# Patient Record
Sex: Female | Born: 1960 | Race: Black or African American | Hispanic: No | Smoking: Former smoker
Health system: Southern US, Community
[De-identification: ages and names within clinical notes are randomized; demographics above are authoritative.]

## PROBLEM LIST (undated history)

## (undated) DIAGNOSIS — D649 Anemia, unspecified: Secondary | ICD-10-CM

## (undated) DIAGNOSIS — K219 Gastro-esophageal reflux disease without esophagitis: Secondary | ICD-10-CM

## (undated) DIAGNOSIS — F419 Anxiety disorder, unspecified: Secondary | ICD-10-CM

## (undated) DIAGNOSIS — M1990 Unspecified osteoarthritis, unspecified site: Secondary | ICD-10-CM

## (undated) DIAGNOSIS — G4730 Sleep apnea, unspecified: Secondary | ICD-10-CM

## (undated) DIAGNOSIS — F329 Major depressive disorder, single episode, unspecified: Secondary | ICD-10-CM

## (undated) DIAGNOSIS — M629 Disorder of muscle, unspecified: Secondary | ICD-10-CM

## (undated) DIAGNOSIS — G5600 Carpal tunnel syndrome, unspecified upper limb: Secondary | ICD-10-CM

## (undated) DIAGNOSIS — G8929 Other chronic pain: Secondary | ICD-10-CM

## (undated) DIAGNOSIS — F32A Depression, unspecified: Secondary | ICD-10-CM

## (undated) HISTORY — PX: ABDOMINAL HYSTERECTOMY: SHX81

## (undated) HISTORY — PX: CHOLECYSTECTOMY: SHX55

## (undated) HISTORY — PX: GYNECOLOGIC CRYOSURGERY: SHX857

## (undated) HISTORY — DX: Unspecified osteoarthritis, unspecified site: M19.90

## (undated) HISTORY — DX: Anemia, unspecified: D64.9

---

## 1999-04-11 HISTORY — PX: CARPAL TUNNEL RELEASE: SHX101

## 2005-04-10 HISTORY — PX: HYSTEROTOMY: SHX1776

## 2007-08-06 ENCOUNTER — Ambulatory Visit: Payer: Self-pay | Admitting: Internal Medicine

## 2007-08-07 ENCOUNTER — Ambulatory Visit: Payer: Self-pay | Admitting: Dermatology

## 2009-01-20 ENCOUNTER — Emergency Department (HOSPITAL_COMMUNITY): Admission: EM | Admit: 2009-01-20 | Discharge: 2009-01-20 | Payer: Self-pay | Admitting: Emergency Medicine

## 2009-04-10 HISTORY — PX: SHOULDER ACROMIOPLASTY: SHX6093

## 2009-09-12 ENCOUNTER — Emergency Department (HOSPITAL_COMMUNITY): Admission: EM | Admit: 2009-09-12 | Discharge: 2009-09-12 | Payer: Self-pay | Admitting: Emergency Medicine

## 2009-09-29 ENCOUNTER — Other Ambulatory Visit: Admission: RE | Admit: 2009-09-29 | Discharge: 2009-09-29 | Payer: Self-pay | Admitting: Family Medicine

## 2009-10-04 ENCOUNTER — Encounter: Admission: RE | Admit: 2009-10-04 | Discharge: 2009-11-17 | Payer: Self-pay | Admitting: Family Medicine

## 2011-04-11 HISTORY — PX: GASTRIC BYPASS: SHX52

## 2011-10-06 ENCOUNTER — Other Ambulatory Visit: Payer: Self-pay | Admitting: Family Medicine

## 2011-10-06 DIAGNOSIS — Z1231 Encounter for screening mammogram for malignant neoplasm of breast: Secondary | ICD-10-CM

## 2011-10-20 ENCOUNTER — Ambulatory Visit
Admission: RE | Admit: 2011-10-20 | Discharge: 2011-10-20 | Disposition: A | Discharge status: Home / Self Care | Payer: Medicare Other | Source: Ambulatory Visit | Attending: Family Medicine | Admitting: Family Medicine

## 2011-10-20 DIAGNOSIS — Z1231 Encounter for screening mammogram for malignant neoplasm of breast: Secondary | ICD-10-CM

## 2012-04-10 HISTORY — PX: LUMBAR FUSION: SHX111

## 2012-04-15 ENCOUNTER — Ambulatory Visit: Payer: Medicare Other | Attending: Orthopedic Surgery

## 2012-04-15 DIAGNOSIS — M25569 Pain in unspecified knee: Secondary | ICD-10-CM | POA: Insufficient documentation

## 2012-04-15 DIAGNOSIS — M25669 Stiffness of unspecified knee, not elsewhere classified: Secondary | ICD-10-CM | POA: Insufficient documentation

## 2012-04-15 DIAGNOSIS — IMO0001 Reserved for inherently not codable concepts without codable children: Secondary | ICD-10-CM | POA: Insufficient documentation

## 2012-04-15 DIAGNOSIS — M6281 Muscle weakness (generalized): Secondary | ICD-10-CM | POA: Insufficient documentation

## 2012-04-15 DIAGNOSIS — R262 Difficulty in walking, not elsewhere classified: Secondary | ICD-10-CM | POA: Insufficient documentation

## 2012-04-18 ENCOUNTER — Ambulatory Visit: Payer: Medicare Other | Admitting: Physical Therapy

## 2012-04-23 ENCOUNTER — Ambulatory Visit: Payer: Medicare Other | Admitting: Physical Therapy

## 2012-04-25 ENCOUNTER — Ambulatory Visit: Payer: Medicare Other | Admitting: Physical Therapy

## 2012-04-30 ENCOUNTER — Ambulatory Visit: Payer: Medicare Other | Admitting: Physical Therapy

## 2012-05-02 ENCOUNTER — Encounter: Payer: Medicare Other | Admitting: Physical Therapy

## 2012-05-07 ENCOUNTER — Ambulatory Visit: Payer: Medicare Other | Admitting: Physical Therapy

## 2012-05-09 ENCOUNTER — Ambulatory Visit: Payer: Medicare Other | Admitting: Physical Therapy

## 2012-05-14 ENCOUNTER — Ambulatory Visit: Payer: Medicare Other | Attending: Orthopedic Surgery | Admitting: Physical Therapy

## 2012-05-14 DIAGNOSIS — IMO0001 Reserved for inherently not codable concepts without codable children: Secondary | ICD-10-CM | POA: Insufficient documentation

## 2012-05-14 DIAGNOSIS — R262 Difficulty in walking, not elsewhere classified: Secondary | ICD-10-CM | POA: Insufficient documentation

## 2012-05-14 DIAGNOSIS — M25569 Pain in unspecified knee: Secondary | ICD-10-CM | POA: Insufficient documentation

## 2012-05-14 DIAGNOSIS — M6281 Muscle weakness (generalized): Secondary | ICD-10-CM | POA: Insufficient documentation

## 2012-05-14 DIAGNOSIS — M25669 Stiffness of unspecified knee, not elsewhere classified: Secondary | ICD-10-CM | POA: Insufficient documentation

## 2012-05-14 DIAGNOSIS — Z96659 Presence of unspecified artificial knee joint: Secondary | ICD-10-CM | POA: Insufficient documentation

## 2012-05-16 ENCOUNTER — Ambulatory Visit: Payer: Medicare Other | Admitting: Physical Therapy

## 2012-05-21 ENCOUNTER — Ambulatory Visit: Payer: Medicare Other | Admitting: Physical Therapy

## 2012-05-23 ENCOUNTER — Ambulatory Visit: Payer: Medicare Other

## 2012-06-04 ENCOUNTER — Encounter: Payer: Self-pay | Admitting: Physical Medicine & Rehabilitation

## 2012-06-21 ENCOUNTER — Ambulatory Visit: Payer: Medicare Other | Admitting: Physical Medicine & Rehabilitation

## 2012-07-09 ENCOUNTER — Ambulatory Visit (HOSPITAL_BASED_OUTPATIENT_CLINIC_OR_DEPARTMENT_OTHER): Payer: Medicare Other | Admitting: Physical Medicine & Rehabilitation

## 2012-07-09 ENCOUNTER — Encounter: Payer: Medicare Other | Attending: Physical Medicine & Rehabilitation

## 2012-07-09 ENCOUNTER — Encounter: Payer: Self-pay | Admitting: Physical Medicine & Rehabilitation

## 2012-07-09 VITALS — BP 133/94 | HR 82 | Resp 14 | Ht 67.0 in | Wt 200.0 lb

## 2012-07-09 DIAGNOSIS — Z79899 Other long term (current) drug therapy: Secondary | ICD-10-CM | POA: Insufficient documentation

## 2012-07-09 DIAGNOSIS — G894 Chronic pain syndrome: Secondary | ICD-10-CM | POA: Insufficient documentation

## 2012-07-09 DIAGNOSIS — M549 Dorsalgia, unspecified: Secondary | ICD-10-CM

## 2012-07-09 DIAGNOSIS — M1710 Unilateral primary osteoarthritis, unspecified knee: Secondary | ICD-10-CM | POA: Insufficient documentation

## 2012-07-09 DIAGNOSIS — M5137 Other intervertebral disc degeneration, lumbosacral region: Secondary | ICD-10-CM | POA: Insufficient documentation

## 2012-07-09 DIAGNOSIS — IMO0002 Reserved for concepts with insufficient information to code with codable children: Secondary | ICD-10-CM | POA: Insufficient documentation

## 2012-07-09 DIAGNOSIS — G8929 Other chronic pain: Secondary | ICD-10-CM

## 2012-07-09 DIAGNOSIS — Z5181 Encounter for therapeutic drug level monitoring: Secondary | ICD-10-CM

## 2012-07-09 DIAGNOSIS — M545 Low back pain, unspecified: Secondary | ICD-10-CM

## 2012-07-09 DIAGNOSIS — M51369 Other intervertebral disc degeneration, lumbar region without mention of lumbar back pain or lower extremity pain: Secondary | ICD-10-CM | POA: Insufficient documentation

## 2012-07-09 DIAGNOSIS — M51379 Other intervertebral disc degeneration, lumbosacral region without mention of lumbar back pain or lower extremity pain: Secondary | ICD-10-CM

## 2012-07-09 DIAGNOSIS — M5136 Other intervertebral disc degeneration, lumbar region: Secondary | ICD-10-CM

## 2012-07-09 MED ORDER — DIAZEPAM 10 MG PO TABS: 10.0000 mg | ORAL_TABLET | Freq: Once | ORAL | Status: DC

## 2012-07-09 NOTE — Progress Notes (Signed)
Subjective:    Patient ID: Dawn Chen, female    DOB: [birth year], 52 y.o.   MRN: [id number]  HPI Larey Seat in a store 09/11/2009. Back pain since that time.Has been seen by pain management. Has had injections. Has trialed narcotic analgesic medicines. Did not tolerate gabapentin. Has been seen by physical therapy and 2011. Not working but is going to school. Tolerates online classes but has difficulty in walking around campus. MRI 01/24/2012 reviewed lumbar disc protrusion L5-S1 with Modic changes at the endplates.Bilateral S1 nerve root impingement Also reviewed records from primary care physician Also reviewed records from [facility] regional physical medicine and rehabilitation. RFA performed Right side L3-L4-L5-4 07/2011 Left side  L3-L4-L5-3  06/2011. Moderate benefit of pain reported. Also had bilateral L5 transforaminal epidural steroid injections with about a two-week benefit. Pain Inventory Average Pain 7 Pain Right Now 10 My pain is stabbing, tingling and aching  In the last 24 hours, has pain interfered with the following? General activity 5 Relation with others 9 Enjoyment of life 10 What TIME of day is your pain at its worst? all Sleep (in general) Poor  Pain is worse with: walking, bending, standing and some activites Pain improves with: rest, heat/ice, medication and injections Relief from Meds: 7  Mobility walk without assistance how many minutes can you walk? 3 ability to climb steps?  yes do you drive?  yes  Function not employed: date last employed 2000 disabled: date disabled 2001 I need assistance with the following:  meal prep and household duties  Neuro/Psych numbness tingling trouble walking spasms depression anxiety  Prior Studies CT/MRI  Physicians involved in your care Any changes since last visit?  no   Family History  Problem Relation Age of Onset  . Cancer Maternal Aunt   . Cancer Maternal Grandmother    History   Social History  .  Marital Status: Single    Spouse Name: N/A    Number of Children: N/A  . Years of Education: N/A   Social History Main Topics  . Smoking status: Current Some Day Smoker    Types: Cigarettes  . Smokeless tobacco: Never Used  . Alcohol Use: None  . Drug Use: None  . Sexually Active: None   Other Topics Concern  . None   Social History Narrative  . None   Past Surgical History  Procedure Laterality Date  . Carpal tunnel release Bilateral 2001  . Hysterotomy  2007  . Shoulder acromioplasty Right 2011  . Joint replacement Right 03/2013    total knee  . Gynecologic cryosurgery    . Abdominal hysterectomy     Past Medical History  Diagnosis Date  . Arthritis   . Anemia    BP 133/94  Pulse 82  Resp 14  Ht 5\' 7"  (1.702 m)  Wt 200 lb (90.719 kg)  BMI 31.32 kg/m2  SpO2 96%    Review of Systems  Constitutional: Positive for diaphoresis.  Musculoskeletal: Positive for back pain and gait problem.       Spasms  Neurological: Positive for numbness.       Tingling  Psychiatric/Behavioral: Positive for dysphoric mood. The patient is nervous/anxious.   All other systems reviewed and are negative.       Objective:   Physical Exam  Nursing note and vitals reviewed. Constitutional: She is oriented to person, place, and time. She appears well-developed and well-nourished.  HENT:  Head: Normocephalic and atraumatic.  Eyes: Conjunctivae and EOM are normal. Pupils are  equal, round, and reactive to light.  Neck: Normal range of motion. Neck supple.  Musculoskeletal:       Right hip: Normal.       Left hip: Normal.       Lumbar back: She exhibits decreased range of motion, tenderness and pain. She exhibits no deformity and no spasm.  Status post Right total knee replacement healing well Full extension 100 flexion  Neurological: She is alert and oriented to person, place, and time. She has normal strength and normal reflexes. No sensory deficit. Coordination and gait  normal.  Negative straight leg raise  Psychiatric: She has a normal mood and affect.          Assessment & Plan:  1. L5-S1 degenerative disc with S1 nerve root impingement, she has mainly axial back pain and no clear-cut signs of radiculopathy.  Has had good success with radiofrequency neurotomy approximately one year ago. It is starting to wear off. We will repeat this, first on the left side then one month later on the right side. Will premedicate with Valium 10 mg by mouth 2. Right knee OA status post TKR doing well from the standpoint 3. Chronic pain syndrome on chronic narcotic analgesics. Apparently has been compliant with previous physical medicine rehabilitation practice. We'll check urine drug screen. If no signs of illicit drugs or nondisclosed opiates will assume prescription. Would switch her to Opana ER 20 mg twice a day and discontinue When necessary  Oxycodone Return to clinic in 2 weeks for the radiofrequency neurotomy

## 2012-07-18 ENCOUNTER — Telehealth: Payer: Self-pay

## 2012-07-18 NOTE — Telephone Encounter (Signed)
Informed patient of nonnarcotic treatment secondary to [facility]

## 2012-07-18 NOTE — Telephone Encounter (Signed)
Message copied by Judd Gaudier on Thu Jul 18, 2012 12:21 PM ------      Message from: Su Monks      Created: Wed Jul 17, 2012 11:15 AM       Should be non narcotic, let Dr. Doroteo Bradford know also ------

## 2012-08-06 ENCOUNTER — Encounter: Payer: Self-pay | Admitting: Physical Medicine & Rehabilitation

## 2012-08-06 ENCOUNTER — Ambulatory Visit: Payer: Medicare Other | Admitting: Physical Medicine & Rehabilitation

## 2012-08-06 NOTE — Progress Notes (Unsigned)
  PROCEDURE RECORD [facility]   Name: Reginna Sermeno DOB:03/16/1961 MRN: [id number]  Date:08/06/2012  Physician: Claudette Laws, MD    Nurse/CMA:Akila Batta RN  Allergies: No Known Allergies  Consent Signed: yes  Is patient diabetic? no  CBG today?   Pregnant: no LMP: No LMP recorded. (age 51-55)hysterectomy  Anticoagulants: no Anti-inflammatory: no Antibiotics: no  Procedure: Left Lumbar Radiofrequency Neurotomy Position: Prone Start Time:  End Time:  Fluoro Time:   RN/CMA Designer, multimedia    Time 3:03     BP 118/80     Pulse 97     Respirations 14     O2 Sat 97     S/S 6     Pain Level 7/10      D/C home with Reuel Boom, patient A & O X 3, D/C instructions reviewed, and sits independently.  Procedure not done at the request of the patient.

## 2012-09-13 ENCOUNTER — Encounter: Payer: Self-pay | Admitting: Physical Medicine & Rehabilitation

## 2012-09-13 ENCOUNTER — Ambulatory Visit (HOSPITAL_BASED_OUTPATIENT_CLINIC_OR_DEPARTMENT_OTHER): Payer: Medicare Other | Admitting: Physical Medicine & Rehabilitation

## 2012-09-13 ENCOUNTER — Encounter: Payer: Medicare Other | Attending: Physical Medicine & Rehabilitation

## 2012-09-13 VITALS — BP 153/88 | HR 98 | Resp 14 | Ht 67.0 in | Wt 197.4 lb

## 2012-09-13 DIAGNOSIS — M51379 Other intervertebral disc degeneration, lumbosacral region without mention of lumbar back pain or lower extremity pain: Secondary | ICD-10-CM | POA: Insufficient documentation

## 2012-09-13 DIAGNOSIS — M5137 Other intervertebral disc degeneration, lumbosacral region: Secondary | ICD-10-CM

## 2012-09-13 DIAGNOSIS — M5136 Other intervertebral disc degeneration, lumbar region: Secondary | ICD-10-CM

## 2012-09-13 DIAGNOSIS — G894 Chronic pain syndrome: Secondary | ICD-10-CM | POA: Insufficient documentation

## 2012-09-13 DIAGNOSIS — M545 Low back pain, unspecified: Secondary | ICD-10-CM

## 2012-09-13 DIAGNOSIS — G8929 Other chronic pain: Secondary | ICD-10-CM

## 2012-09-13 DIAGNOSIS — M549 Dorsalgia, unspecified: Secondary | ICD-10-CM | POA: Insufficient documentation

## 2012-09-13 DIAGNOSIS — IMO0002 Reserved for concepts with insufficient information to code with codable children: Secondary | ICD-10-CM | POA: Insufficient documentation

## 2012-09-13 DIAGNOSIS — M1710 Unilateral primary osteoarthritis, unspecified knee: Secondary | ICD-10-CM | POA: Insufficient documentation

## 2012-09-13 DIAGNOSIS — Z79899 Other long term (current) drug therapy: Secondary | ICD-10-CM | POA: Insufficient documentation

## 2012-09-13 NOTE — Progress Notes (Signed)
  Subjective:    Patient ID: Dawn Chen, female    DOB: [birth year], 52 y.o.   MRN: [id number]  HPI Larey Seat in a store 09/11/2009. Back pain since that time.Has been seen by pain management. Has had injections. Has trialed narcotic analgesic medicines. Did not tolerate gabapentin. Has been seen by physical therapy and 2011. Not working but is going to school. Tolerates online classes but has difficulty in walking around campus.  MRI 01/24/2012 reviewed lumbar disc protrusion L5-S1 with Modic changes at the endplates.Bilateral S1 nerve root impingement  Also reviewed records from primary care physician  Also reviewed records from [facility] regional physical medicine and rehabilitation. RFA performed Right side L3-L4-L5-4 07/2011 Left side L3-L4-L5-3 06/2011. Moderate benefit of pain reported. Also had bilateral L5 transforaminal epidural steroid injections with about a two-week benefit. UDS positive for THC. Pain Inventory Average Pain 8 Pain Right Now 8 My pain is sharp, stabbing and aching  In the last 24 hours, has pain interfered with the following? General activity 9 Relation with others 10 Enjoyment of life 10 What TIME of day is your pain at its worst? varies Sleep (in general) Poor  Pain is worse with: walking, bending and standing Pain improves with: rest, heat/ice and medication Relief from Meds: 8  Mobility walk without assistance how many minutes can you walk? 3 ability to climb steps?  yes do you drive?  yes  Function not employed: date last employed 2000  Neuro/Psych weakness numbness tingling trouble walking depression  Prior Studies Any changes since last visit?  no  Physicians involved in your care Any changes since last visit?  no   Family History  Problem Relation Age of Onset  . Cancer Maternal Aunt   . Cancer Maternal Grandmother    History   Social History  . Marital Status: Single    Spouse Name: N/A    Number of Children: N/A  . Years of  Education: N/A   Social History Main Topics  . Smoking status: Current Some Day Smoker    Types: Cigarettes  . Smokeless tobacco: Never Used  . Alcohol Use: None  . Drug Use: None  . Sexually Active: None   Other Topics Concern  . None   Social History Narrative  . None   Past Surgical History  Procedure Laterality Date  . Carpal tunnel release Bilateral 2001  . Hysterotomy  2007  . Shoulder acromioplasty Right 2011  . Joint replacement Right 03/2013    total knee  . Gynecologic cryosurgery    . Abdominal hysterectomy     Past Medical History  Diagnosis Date  . Arthritis   . Anemia    BP 153/88  Pulse 98  Resp 14  Ht 5\' 7"  (1.702 m)  Wt 197 lb 6.4 oz (89.54 kg)  BMI 30.91 kg/m2  SpO2 99%    Review of Systems  Musculoskeletal: Positive for back pain and gait problem.       Spasms  Neurological: Positive for weakness and numbness.       Tingling       Objective:   Physical Exam        Assessment & Plan:  1. Chronic radiculitis from lumbar degenerative disc. Has been on chronic narcotic analgesics. We discussed clinic policy of not prescribing narcotic analgesics in patients with THC positive for other illicit drug abuse. Patient will followup with prior pain management physician to reestablish care. Co-pay refunded

## 2012-10-19 ENCOUNTER — Encounter (HOSPITAL_COMMUNITY): Payer: Self-pay | Admitting: *Deleted

## 2012-10-19 ENCOUNTER — Emergency Department (HOSPITAL_COMMUNITY)
Admission: EM | Admit: 2012-10-19 | Discharge: 2012-10-20 | Disposition: A | Discharge status: Home / Self Care | Payer: Medicare Other | Attending: Emergency Medicine | Admitting: Emergency Medicine

## 2012-10-19 DIAGNOSIS — M545 Low back pain, unspecified: Secondary | ICD-10-CM | POA: Insufficient documentation

## 2012-10-19 DIAGNOSIS — M7989 Other specified soft tissue disorders: Secondary | ICD-10-CM | POA: Insufficient documentation

## 2012-10-19 DIAGNOSIS — M79609 Pain in unspecified limb: Secondary | ICD-10-CM | POA: Insufficient documentation

## 2012-10-19 DIAGNOSIS — Z79899 Other long term (current) drug therapy: Secondary | ICD-10-CM | POA: Insufficient documentation

## 2012-10-19 DIAGNOSIS — F17200 Nicotine dependence, unspecified, uncomplicated: Secondary | ICD-10-CM | POA: Insufficient documentation

## 2012-10-19 DIAGNOSIS — D649 Anemia, unspecified: Secondary | ICD-10-CM | POA: Insufficient documentation

## 2012-10-19 DIAGNOSIS — Z8739 Personal history of other diseases of the musculoskeletal system and connective tissue: Secondary | ICD-10-CM | POA: Insufficient documentation

## 2012-10-19 DIAGNOSIS — Z9889 Other specified postprocedural states: Secondary | ICD-10-CM | POA: Insufficient documentation

## 2012-10-19 DIAGNOSIS — G8928 Other chronic postprocedural pain: Secondary | ICD-10-CM | POA: Insufficient documentation

## 2012-10-19 LAB — CBC
HCT: 23.3 % — ABNORMAL LOW (ref 36.0–46.0)
Hemoglobin: 7.8 g/dL — ABNORMAL LOW (ref 12.0–15.0)
MCH: 31.8 pg (ref 26.0–34.0)
MCHC: 33.5 g/dL (ref 30.0–36.0)
MCV: 95.1 fL (ref 78.0–100.0)
Platelets: 413 10*3/uL — ABNORMAL HIGH (ref 150–400)
RBC: 2.45 MIL/uL — ABNORMAL LOW (ref 3.87–5.11)
RDW: 14.3 % (ref 11.5–15.5)
WBC: 8.2 10*3/uL (ref 4.0–10.5)

## 2012-10-19 LAB — POCT I-STAT, CHEM 8
BUN: 12 mg/dL (ref 6–23)
Calcium, Ion: 1.17 mmol/L (ref 1.12–1.23)
Chloride: 107 mEq/L (ref 96–112)
Creatinine, Ser: 0.9 mg/dL (ref 0.50–1.10)
Glucose, Bld: 110 mg/dL — ABNORMAL HIGH (ref 70–99)
HCT: 24 % — ABNORMAL LOW (ref 36.0–46.0)
Hemoglobin: 8.2 g/dL — ABNORMAL LOW (ref 12.0–15.0)
Potassium: 5.1 mEq/L (ref 3.5–5.1)
Sodium: 139 mEq/L (ref 135–145)
TCO2: 25 mmol/L (ref 0–100)

## 2012-10-19 MED ORDER — HYDROMORPHONE HCL PF 1 MG/ML IJ SOLN
1.0000 mg | Freq: Once | INTRAMUSCULAR | Status: AC
  Administered 2012-10-20: 1 mg via INTRAVENOUS
  Filled 2012-10-19: qty 1

## 2012-10-19 MED ORDER — ONDANSETRON HCL 4 MG/2ML IJ SOLN
4.0000 mg | Freq: Once | INTRAMUSCULAR | Status: AC
  Administered 2012-10-20: 4 mg via INTRAVENOUS
  Filled 2012-10-19: qty 2

## 2012-10-19 MED ORDER — SODIUM CHLORIDE 0.9 % IV SOLN
INTRAVENOUS | Status: DC
  Administered 2012-10-20: via INTRAVENOUS

## 2012-10-19 NOTE — ED Provider Notes (Signed)
History    CSN: [id number] Arrival date & time 10/19/12  2130  First MD Initiated Contact with Patient 10/19/12 2330     Chief Complaint  Patient presents with  . multiple conplaints    (Consider location/radiation/quality/duration/timing/severity/associated sxs/prior Treatment) HPI Hx per PT - lumbar surgery 10-14-12 at [facility], she believes she had a lumbar fusion.  Her back pain has improved since surgery but is still present.  Had LLE pain and numbness before surgery and still has some since.  Today noticed swelling in left calf with pain in left thigh that is new. No CP or SOB. No h/o DVT/ PE. Is not on lovenox. Pain is dull in her back and leg, worse in her back with any movement.   PT also expresses concern about weight gain of 10-14 pounds since surgery despite large BM today. No DOE or orthopnea.   RLE swelling unchanged from previous knee surgery  Past Medical History  Diagnosis Date  . Arthritis   . Anemia    Past Surgical History  Procedure Laterality Date  . Carpal tunnel release Bilateral 2001  . Hysterotomy  2007  . Shoulder acromioplasty Right 2011  . Joint replacement Right 03/2013    total knee  . Gynecologic cryosurgery    . Abdominal hysterectomy     Family History  Problem Relation Age of Onset  . Cancer Maternal Aunt   . Cancer Maternal Grandmother    History  Substance Use Topics  . Smoking status: Current Some Day Smoker    Types: Cigarettes  . Smokeless tobacco: Never Used  . Alcohol Use: Not on file   OB History   Grav Para Term Preterm Abortions TAB SAB Ect Mult Living                 Review of Systems  Constitutional: Negative for fever and chills.  HENT: Negative for neck pain and neck stiffness.   Eyes: Negative for pain.  Respiratory: Negative for shortness of breath.   Cardiovascular: Positive for chest pain.  Gastrointestinal: Negative for abdominal pain.  Genitourinary: Negative for dysuria.  Musculoskeletal: Positive for  back pain.  Skin: Negative for rash.  Neurological: Negative for headaches.  All other systems reviewed and are negative.    Allergies  Gabapentin  Home Medications   Current Outpatient Rx  Name  Route  Sig  Dispense  Refill  . buPROPion (WELLBUTRIN XL) 150 MG 24 hr tablet   Oral   Take 150 mg by mouth daily.         . calcium gluconate 500 MG tablet   Oral   Take 500 mg by mouth 2 (two) times daily.         . Cholecalciferol (D3-1000) 1000 UNITS capsule   Oral   Take 1,000 Units by mouth daily.         . Cyanocobalamin (B-12) 1500 MCG TBCR   Oral   Take 1,500 mcg by mouth daily. In addition to the         . escitalopram (LEXAPRO) 20 MG tablet   Oral   Take 20 mg by mouth daily.         . Ferrous Sulfate (IRON) 325 (65 FE) MG TABS   Oral   Take 1 tablet by mouth daily.         . Multiple Vitamins-Minerals (MULTIVITAMIN WITH MINERALS) tablet   Oral   Take 1 tablet by mouth daily.         Marland Kitchen  oxyCODONE (OXYCONTIN) 10 MG 12 hr tablet   Oral   Take 10 mg by mouth every 12 (twelve) hours.         Marland Kitchen oxyCODONE-acetaminophen (PERCOCET/ROXICET) 5-325 MG per tablet   Oral   Take 1 tablet by mouth every 4 (four) hours as needed for pain.         . pregabalin (LYRICA) 75 MG capsule   Oral   Take 75 mg by mouth 2 (two) times daily.         . QUEtiapine (SEROQUEL) 300 MG tablet   Oral   Take 300 mg by mouth at bedtime.          BP 100/57  Pulse 99  Temp(Src) 99.4 F (37.4 C) (Oral)  Resp 18  SpO2 97% Physical Exam  Nursing note and vitals reviewed. Constitutional: She is oriented to person, place, and time. She appears well-developed and well-nourished.  HENT:  Head: Normocephalic and atraumatic.  Eyes: EOM are normal. Pupils are equal, round, and reactive to light.  Neck: Neck supple.  Cardiovascular: Normal heart sounds and intact distal pulses.   Borderline tachycardia  Pulmonary/Chest: Effort normal and breath sounds normal.  No respiratory distress.  Abdominal: Soft. Bowel sounds are normal. She exhibits no distension. There is no tenderness.  Musculoskeletal: Normal range of motion.  Lumbar surgical site c/d/i no swelling or erythema LLE calf mild edema, no cords.   Neurological: She is alert and oriented to person, place, and time.  Skin: Skin is warm and dry.    ED Course  Procedures (including critical care time)  Results for orders placed during the hospital encounter of 10/19/12  CBC      Result Value Range   WBC 8.2  4.0 - 10.5 K/uL   RBC 2.45 (*) 3.87 - 5.11 MIL/uL   Hemoglobin 7.8 (*) 12.0 - 15.0 g/dL   HCT 60.4 (*) 54.0 - 98.1 %   MCV 95.1  78.0 - 100.0 fL   MCH 31.8  26.0 - 34.0 pg   MCHC 33.5  30.0 - 36.0 g/dL   RDW 19.1  47.8 - 29.5 %   Platelets 413 (*) 150 - 400 K/uL  POCT I-STAT, CHEM 8      Result Value Range   Sodium 139  135 - 145 mEq/L   Potassium 5.1  3.5 - 5.1 mEq/L   Chloride 107  96 - 112 mEq/L   BUN 12  6 - 23 mg/dL   Creatinine, Ser 6.21  0.50 - 1.10 mg/dL   Glucose, Bld 308 (*) 70 - 99 mg/dL   Calcium, Ion 6.57  8.46 - 1.23 mmol/L   TCO2 25  0 - 100 mmol/L   Hemoglobin 8.2 (*) 12.0 - 15.0 g/dL   HCT 96.2 (*) 95.2 - 84.1 %    IV fluids. IV Dilaudid.  Patient aware is at risk for DVT in evening with swelling, given Lovenox and scheduled for ultrasound to vascular lab in the morning. Patient discharged in stable condition with all discharge and followup instructions verbalized as understood. She agrees to followup with her physicians at [facility]  MDM  Left leg pain and swelling with recent lumbar surgery. No symptoms of PE  Labs obtained and reviewed as above, creatinine within normal limits. Patient is aware of chronic anemia and takes iron tablets. She will followup with her primary care physician regarding anemia  IV narcotics provided. Lovenox provided. Ultrasound scheduled  Vital signs and nurses notes reviewed  Arlys John  Dierdre Highman, MD 10/20/12 [phone]

## 2012-10-19 NOTE — ED Notes (Signed)
The pt had back surgery  July 7th no bm since Sunday.  The pt has had a weight gain.  She had a bm today that was larger than normal.  She also took an enema mirilax and salt water in the past few days.  Pain in her legs  Etc.  She takes oxycodone oxycotin

## 2012-10-19 NOTE — ED Notes (Signed)
Pt. Was in a lot of lower back pain. Pt refused to go to the restroom in the wheelchair. Pt wanted to walk to use restroom. Pt was assisted to the restroom by the tech.

## 2012-10-20 ENCOUNTER — Encounter (HOSPITAL_COMMUNITY): Payer: Medicare Other

## 2012-10-20 ENCOUNTER — Ambulatory Visit (HOSPITAL_COMMUNITY)
Admission: RE | Admit: 2012-10-20 | Discharge: 2012-10-20 | Disposition: A | Discharge status: Home / Self Care | Payer: Medicare Other | Source: Ambulatory Visit | Attending: Emergency Medicine | Admitting: Emergency Medicine

## 2012-10-20 DIAGNOSIS — M79609 Pain in unspecified limb: Secondary | ICD-10-CM

## 2012-10-20 MED ORDER — ENOXAPARIN SODIUM 100 MG/ML ~~LOC~~ SOLN
1.0000 mg/kg | Freq: Once | SUBCUTANEOUS | Status: AC
  Administered 2012-10-20: 90 mg via SUBCUTANEOUS
  Filled 2012-10-20: qty 1

## 2012-10-20 NOTE — Progress Notes (Signed)
VASCULAR LAB PRELIMINARY  PRELIMINARY  PRELIMINARY  PRELIMINARY  Left lower extremity venous Doppler completed.    Preliminary report:  There is no DVT or SVT noted in the left lower extremity.  Leylany Nored, RVT 10/20/2012, 10:36 AM

## 2013-03-10 HISTORY — PX: JOINT REPLACEMENT: SHX530

## 2014-02-18 ENCOUNTER — Emergency Department (HOSPITAL_COMMUNITY)
Admission: EM | Admit: 2014-02-18 | Discharge: 2014-02-18 | Disposition: A | Discharge status: Home / Self Care | Payer: Medicare Other | Attending: Emergency Medicine | Admitting: Emergency Medicine

## 2014-02-18 ENCOUNTER — Encounter (HOSPITAL_COMMUNITY): Payer: Self-pay

## 2014-02-18 DIAGNOSIS — Z720 Tobacco use: Secondary | ICD-10-CM | POA: Diagnosis not present

## 2014-02-18 DIAGNOSIS — M25562 Pain in left knee: Secondary | ICD-10-CM | POA: Insufficient documentation

## 2014-02-18 DIAGNOSIS — F446 Conversion disorder with sensory symptom or deficit: Secondary | ICD-10-CM | POA: Insufficient documentation

## 2014-02-18 DIAGNOSIS — M1990 Unspecified osteoarthritis, unspecified site: Secondary | ICD-10-CM | POA: Diagnosis not present

## 2014-02-18 DIAGNOSIS — Z79891 Long term (current) use of opiate analgesic: Secondary | ICD-10-CM | POA: Diagnosis not present

## 2014-02-18 DIAGNOSIS — Z79899 Other long term (current) drug therapy: Secondary | ICD-10-CM | POA: Diagnosis not present

## 2014-02-18 DIAGNOSIS — D649 Anemia, unspecified: Secondary | ICD-10-CM | POA: Diagnosis not present

## 2014-02-18 DIAGNOSIS — M545 Low back pain: Secondary | ICD-10-CM | POA: Insufficient documentation

## 2014-02-18 DIAGNOSIS — Z981 Arthrodesis status: Secondary | ICD-10-CM | POA: Insufficient documentation

## 2014-02-18 DIAGNOSIS — G8929 Other chronic pain: Secondary | ICD-10-CM | POA: Diagnosis not present

## 2014-02-18 MED ORDER — QUETIAPINE FUMARATE ER 300 MG PO TB24: 600.0000 mg | ORAL_TABLET | Freq: Every day | ORAL | Status: DC

## 2014-02-18 MED ORDER — OXYCODONE HCL 5 MG PO TABS: 5.0000 mg | ORAL_TABLET | Freq: Four times a day (QID) | ORAL | Status: DC | PRN

## 2014-02-18 MED ORDER — BUPROPION HCL ER (XL) 150 MG PO TB24: 150.0000 mg | ORAL_TABLET | Freq: Every day | ORAL | Status: DC

## 2014-02-18 MED ORDER — SERTRALINE HCL 25 MG PO TABS: 25.0000 mg | ORAL_TABLET | Freq: Every day | ORAL | Status: DC

## 2014-02-18 NOTE — ED Notes (Signed)
Pt reports she struggles with sciatica and right knee pain but yesterday the pain increased. She also revealed she suffers with panic attacks and she recently lost a close family member and she thinks this could be a contributing factor to her flare up of back and knee pain.

## 2014-02-18 NOTE — Discharge Instructions (Signed)
Chronic Back Pain ° When back pain lasts longer than 3 months, it is called chronic back pain. People with chronic back pain often go through certain periods that are more intense (flare-ups).  °CAUSES °Chronic back pain can be caused by wear and tear (degeneration) on different structures in your back. These structures include: °· The bones of your spine (vertebrae) and the joints surrounding your spinal cord and nerve roots (facets). °· The strong, fibrous tissues that connect your vertebrae (ligaments). °Degeneration of these structures may result in pressure on your nerves. This can lead to constant pain. °HOME CARE INSTRUCTIONS °· Avoid bending, heavy lifting, prolonged sitting, and activities which make the problem worse. °· Take brief periods of rest throughout the day to reduce your pain. Lying down or standing usually is better than sitting while you are resting. °· Take over-the-counter or prescription medicines only as directed by your caregiver. °SEEK IMMEDIATE MEDICAL CARE IF:  °· You have weakness or numbness in one of your legs or feet. °· You have trouble controlling your bladder or bowels. °· You have nausea, vomiting, abdominal pain, shortness of breath, or fainting. °Document Released: 05/04/2004 Document Revised: 06/19/2011 Document Reviewed: 03/11/2011 °ExitCare® Patient Information ©2015 [employer]. This information is not intended to replace advice given to you by your health care provider. Make sure you discuss any questions you have with your health care provider. ° ° °Emergency Department Resource Guide °1) Find a Doctor and Pay Out of Pocket °Although you won't have to find out who is covered by your insurance plan, it is a good idea to ask around and get recommendations. You will then need to call the office and see if the doctor you have chosen will accept you as a new patient and what types of options they offer for patients who are self-pay. Some doctors offer discounts or will set  up payment plans for their patients who do not have insurance, but you will need to ask so you aren't surprised when you get to your appointment. ° °2) Contact Your Local Health Department °Not all health departments have doctors that can see patients for sick visits, but many do, so it is worth a call to see if yours does. If you don't know where your local health department is, you can check in your phone book. The CDC also has a tool to help you locate your state's health department, and many state websites also have listings of all of their local health departments. ° °3) Find a Walk-in Clinic °If your illness is not likely to be very severe or complicated, you may want to try a walk in clinic. These are popping up all over the country in pharmacies, drugstores, and shopping centers. They're usually staffed by nurse practitioners or physician assistants that have been trained to treat common illnesses and complaints. They're usually fairly quick and inexpensive. However, if you have serious medical issues or chronic medical problems, these are probably not your best option. ° °No Primary Care Doctor: °- Call [facility] at  832-8000 - they can help you locate a primary care doctor that  accepts your insurance, provides certain services, etc. °- Physician Referral Service- 1-800-533-3463 ° °Chronic Pain Problems: °Organization         Address  Phone   Notes  °[facility]  (336) 297-2271 Patients need to be referred by their primary care doctor.  ° °Medication Assistance: °Organization         Address    Phone   Notes  °[place] [facility] [address] °[place], [place] [address] (336) 641-8030 --Must be a resident of [place] °-- Must have NO insurance coverage whatsoever (no Medicaid/ Medicare, etc.) °-- The pt. MUST have a primary care doctor that directs their care regularly and follows them in the community °  °[facility]  (866) 331-1348    °[employer]  (888) 892-1162   ° °Agencies that provide inexpensive medical care: °Organization         Address  Phone   Notes  °[facility]  (336) 832-8035   °[facility]    (336) 832-7272   °[facility] [address] °[place], [place] [address] (336) 832-4777   °[facility] [address], °[place] (336) 271-4999   °[facility]    (336) 373-0678   °[facility]    (336) 272-1050   °[facility] ° [address], [place] Phone:  (336) 832-4444, Fax:  (336) 832-4440 Hours of Operation:  9 am - 6 pm, M-F.  Also accepts Medicaid/Medicare and self-pay.  ° [facility] ° [address], [place] Phone: (336) 832-3150, Fax: (336) 832-3151. Hours of Operation:  8:30 am - 5:30 pm, M-F.  Also accepts Medicaid and self-pay.  °[facility] [place] [address], [place] Phone: (336) 878-6027   °[facility] [address], [place], [place] (336)723-1848, Ext. 123 Mondays & Thursdays: 7-9 AM.  First 15 patients are seen on a first come, first serve basis. °  ° °Medicaid-accepting [place] Providers: ° °Organization         Address  Phone   Notes  °[facility] [address], [place] (336) 641-2100 Also accepts self-pay patients.  °[facility] [address], [place] ° (336) 856-9996   °[facility] [address], [place] (336) 288-8857   °[facility] [address] [place] [address], [place] (336) 299-7000   °Veita Bland [address], [place]  ° (336) 373-1557 Only accepts [place] Access Medicaid patients after they have their name applied to their card.  ° °Self-Pay (no insurance) in [place]: ° °Organization         Address  Phone   Notes  °Sickle Cell Patients, [facility] [address],  [place] (336) 832-1970   °[facility] [address], [place] (336) 832-4400   °[facility] ° [address], [place] (336) 992-4800   °[facility]/Dr. Osei-Bonsu ° [address] [place] [address], [place] or [address], [place] (336) 841-8500 Phone number for both [place] and [place] locations is the same.  °[facility] [address], [place] (336) 299-0000   °[facility] [address] [place] [address],  or [address] (336) 852-7530 °(336) 878-2260   °[facility] [address],  (336) 350-1642, phone; (336) 294-5005, fax Sees patients 1st and 3rd Saturday of every month.  Must not qualify for public or private insurance (i.e. Medicaid, Medicare, [place] Health Choice, Veterans' Benefits) • Household income should be no more than 200% of the poverty level •The clinic cannot treat you if you are pregnant or think you are pregnant •   Sexually transmitted diseases are not treated at the clinic.  ° ° °Dental Care: °Organization         Address  Phone  Notes  °[facility] [address], [place] (336) 641-6152 Accepts children up to age 21 who are enrolled in Medicaid or [place] Health Choice; pregnant women with a Medicaid card; and children who have applied for Medicaid or [place] Health Choice, but were declined, whose parents can pay a reduced fee at time of service.  °[facility] [place]  [address], [place] (336) 641-7733 Accepts children up to age 21 who are enrolled in Medicaid or [place] Health Choice; pregnant women with a Medicaid card; and children who have applied for Medicaid or [place] Health Choice, but were declined, whose parents can pay a reduced fee at time of service.  °[facility] ° [address], [place] (336)  641-4533 Patients are seen by appointment only. Walk-ins are not accepted. [facility] will see patients 18 years of age and older. °Monday - Tuesday (8am-5pm) °Most Wednesdays (8:30-5pm) °$30 per visit, cash only  °[facility] ° [address], [place] (336) 641-4533 Patients are seen by appointment only. Walk-ins are not accepted. [facility] will see patients 18 years of age and older. °One Wednesday Evening (Monthly: Volunteer Based).  $30 per visit, cash only  °[facility]  (919) 537-3737 for adults; Children under age 4, call Graduate Pediatric Dentistry at ([phone]) [phone]. Children aged 4-14, please call (919) [phone] to request a pediatric application. ° Dental services are provided in all areas of dental care including fillings, crowns and bridges, complete and partial dentures, implants, gum treatment, root canals, and extractions. Preventive care is also provided. Treatment is provided to both adults and children. °Patients are selected via a lottery and there is often a waiting list. °  °[facility] [address], °[place] ° (336) 763-8833 [email or web address] °  °[facility] [address], [place], [place] (336)723-1848, Ext. 123 Second and Fourth Thursday of each month, opens at 6:30 AM; Clinic ends at 9 AM.  Patients are seen on a first-come first-served basis, and a limited number are seen during each clinic.  ° °[facility] ° [address], [place], [place] (336) 723-7904   Eligibility Requirements °You must have lived in [place], [place], or [place] counties for at least the last three months. °  You cannot be eligible for state or federal sponsored healthcare insurance, including Veterans Administration, Medicaid, or Medicare. °  You generally cannot be eligible for healthcare insurance through your employer.  °  How to apply: °Eligibility screenings are held every Tuesday and Wednesday afternoon  from 1:00 pm until 4:00 pm. You do not need an appointment for the interview!  °[facility] [address], [place], [place] 336-631-2330   °[facility]  336-342-8273   °[facility]  336-703-3100   °[facility]  336-570-6415   ° °Behavioral Health Resources in the Community: °Intensive Outpatient Programs °Organization         Address  Phone  Notes  °[facility] [address], [place], [place] 336-878-6098   °[facility] [address], [place], [place] 336-832-9800   °[facility] [address], [place], [place] °   336-882-2125   °[facility] [address],  °[place], [place] 1-800-853-5163 or 336-641-4981   °Substance Abuse Resources °Organization         Address  Phone  Notes  °[facility]  336-882-2125   °[facility]  336-784-9470   °[facility]  336-285-9073   °[facility]  336-845-3988   °Residential & Outpatient Substance Abuse Program  1-800-659-3381   °Psychological Services °Organization         Address  Phone  Notes  °[facility]  336- 832-9600   °[facility]  336- 378-7881   °[facility] [address], [place] 1-800-853-5163 or 336-641-4981   ° °Mobile Crisis Teams °Organization         Address  Phone  Notes  °[facility], Mobile Crisis Care Unit  1-877-626-1772   °Assertive °[facility] ° [address] [place], [place] 336-834-9664   °Sharon DeEsch [address] °[place] 336-554-5454   ° °Self-Help/Support Groups °Organization         Address  Phone             Notes  °[facility] [place] - variety of support groups  336- 373-1402 Call for more information  °Narcotics Anonymous (NA), [facility] [address], °[place]  2 meetings at this location  ° °Residential Treatment  Programs °Organization         Address  Phone  Notes  °[facility] [address],    °[place]  1-866-801-8205   °[facility] ° [address], [place], [place] 704-293-8524   °[facility] [address], [place] 336-845-3988 Admissions: 8am-3pm M-F  °[facility] [address]    °[place], [place] 336-841-1104   °[facility] [address], [place], [place] 336-379-7146   °[facility] [address]  °[place], [place] 336-285-9073   °[facility] Intensive Outpatient [address], [place], [place] 336-852-3033   °[facility] [address]  °[place], [place] 1-877-615-2722 or 336-784-9470   °[facility] [address] [place], [place] 336-227-7417 Accepts Medicaid  °[facility] [address]  ° [place] 1-800-659-3381 Substance Abuse/Addiction Treatment  ° °[place] Behavioral Health Resources °Organization         Address  Phone  Notes  °[facility]  (888) 581-9988   °Julie Brannon, PhD [address] [place], [place]   (336) 349-5553 or (336) 951-0000   °[facility]   [address] °[place], [place] (336) 349-4454   °[facility] [address], [place], [place] (336) 342-8316 Insurance/Medicaid/sponsorship through [facility]  °[facility] [address]                                    [place], [place] (336) 342-8316 Therapy/tele-psych/case  °[facility] [address]  ° [place], [place] (336) 349-2233    °Dr. Arfeen  (336) 349-4544   °[facility] [place]  [employer] [place] [facility] 1) [address], [place] °2) [address], [place] °3)  [address], [place] (336) 349-3220 °(336) 342-7768 ° °(336) 342-8140   °[place] [facility] (336) 342-1394 or (336) 342-3537 (  After Hours)    ° ° ° °

## 2014-02-18 NOTE — ED Notes (Signed)
MD at bedside. 

## 2014-02-18 NOTE — ED Provider Notes (Signed)
CSN: [id number]     Arrival date & time 02/18/14  0620 History   First MD Initiated Contact with Patient 02/18/14 [phone]     Chief Complaint  Patient presents with  . Back Pain  . Knee Pain     (Consider location/radiation/quality/duration/timing/severity/associated sxs/prior Treatment) Patient is a 53 y.o. female presenting with back pain and knee pain. The history is provided by the patient.  Back Pain Location:  Lumbar spine and gluteal region Quality:  Aching and shooting Radiates to:  L posterior upper leg, L knee and L foot Pain severity:  Severe Pain is:  Same all the time Onset quality:  Gradual Timing:  Constant Progression:  Unchanged Chronicity:  Chronic Context: not recent injury   Context comment:  Out of her medications, prior lumbar fusion surgery Relieved by:  Nothing Worsened by:  Nothing tried Associated symptoms: no abdominal pain, no chest pain and no fever   Knee Pain Associated symptoms: back pain   Associated symptoms: no fever     Past Medical History  Diagnosis Date  . Arthritis   . Anemia    Past Surgical History  Procedure Laterality Date  . Carpal tunnel release Bilateral 2001  . Hysterotomy  2007  . Shoulder acromioplasty Right 2011  . Joint replacement Right 03/2013    total knee  . Gynecologic cryosurgery    . Abdominal hysterectomy     Family History  Problem Relation Age of Onset  . Cancer Maternal Aunt   . Cancer Maternal Grandmother    History  Substance Use Topics  . Smoking status: Current Some Day Smoker    Types: Cigarettes  . Smokeless tobacco: Never Used  . Alcohol Use: Not on file   OB History    No data available     Review of Systems  Constitutional: Negative for fever and chills.  Respiratory: Negative for cough and shortness of breath.   Cardiovascular: Negative for chest pain and leg swelling.  Gastrointestinal: Negative for vomiting and abdominal pain.  Musculoskeletal: Positive for back pain.  All  other systems reviewed and are negative.     Allergies  Gabapentin  Home Medications   Prior to Admission medications   Medication Sig Start Date End Date Taking? Authorizing Provider  Naproxen Sod-Diphenhydramine (ALEVE PM) 220-25 MG TABS Take 1 tablet by mouth at bedtime as needed (for pain).   Yes Historical Provider, MD  oxycodone (ROXICODONE) 30 MG immediate release tablet Take 30 mg by mouth every 8 (eight) hours as needed for pain.   Yes Historical Provider, MD  buPROPion (WELLBUTRIN XL) 150 MG 24 hr tablet Take 1 tablet (150 mg total) by mouth daily. 02/18/14   Elwin MochaBlair Rafan Sanders, MD  calcium gluconate 500 MG tablet Take 500 mg by mouth 2 (two) times daily.    Historical Provider, MD  Cholecalciferol (D3-1000) 1000 UNITS capsule Take 1,000 Units by mouth daily.    Historical Provider, MD  Cyanocobalamin (B-12) 1500 MCG TBCR Take 1,500 mcg by mouth daily. In addition to the 1000mcg    Historical Provider, MD  escitalopram (LEXAPRO) 20 MG tablet Take 20 mg by mouth daily.    Adaku Nnodi, MD  Ferrous Sulfate (IRON) 325 (65 FE) MG TABS Take 1 tablet by mouth daily.    Historical Provider, MD  Multiple Vitamins-Minerals (MULTIVITAMIN WITH MINERALS) tablet Take 1 tablet by mouth daily.    Historical Provider, MD  oxyCODONE (OXYCONTIN) 10 MG 12 hr tablet Take 10 mg by mouth every 12 (twelve)  hours.    Historical Provider, MD  oxyCODONE (ROXICODONE) 5 MG immediate release tablet Take 1 tablet (5 mg total) by mouth every 6 (six) hours as needed for moderate pain or severe pain. 02/18/14   Elwin MochaBlair Haruo Stepanek, MD  oxyCODONE-acetaminophen (PERCOCET/ROXICET) 5-325 MG per tablet Take 1 tablet by mouth every 4 (four) hours as needed for pain.    Historical Provider, MD  pregabalin (LYRICA) 75 MG capsule Take 75 mg by mouth 2 (two) times daily.    Historical Provider, MD  QUEtiapine (SEROQUEL XR) 300 MG 24 hr tablet Take 2 tablets (600 mg total) by mouth at bedtime. 02/18/14   Elwin MochaBlair Telesia Ates, MD  QUEtiapine  (SEROQUEL) 300 MG tablet Take 300 mg by mouth at bedtime.    Historical Provider, MD  sertraline (ZOLOFT) 25 MG tablet Take 1 tablet (25 mg total) by mouth daily. 02/18/14   Elwin MochaBlair Kushi Kun, MD   BP 142/89 mmHg  Pulse 83  Temp(Src) 98.3 F (36.8 C) (Oral)  SpO2 99% Physical Exam  Constitutional: She is oriented to person, place, and time. She appears well-developed and well-nourished. No distress.  HENT:  Head: Normocephalic and atraumatic.  Mouth/Throat: Oropharynx is clear and moist.  Eyes: EOM are normal. Pupils are equal, round, and reactive to light.  Neck: Normal range of motion. Neck supple.  Cardiovascular: Normal rate and regular rhythm.  Exam reveals no friction rub.   No murmur heard. Pulmonary/Chest: Effort normal and breath sounds normal. No respiratory distress. She has no wheezes. She has no rales.  Abdominal: Soft. She exhibits no distension. There is no tenderness. There is no rebound.  Musculoskeletal: Normal range of motion. She exhibits no edema.  Neurological: She is alert and oriented to person, place, and time. A sensory deficit (light touch in left leg altered when compared to the left) is present. No cranial nerve deficit. GCS eye subscore is 4. GCS verbal subscore is 5. GCS motor subscore is 6.  Skin: She is not diaphoretic.  Nursing note and vitals reviewed.   ED Course  Procedures (including critical care time) Labs Review Labs Reviewed - No data to display  Imaging Review No results found.   EKG Interpretation None      MDM   Final diagnoses:  Chronic pain    53 year old female history of chronic back pain and chronic knee pain after lumbar fusion surgery and a total knee arthroplasty presents with pain. Has been out of her medicines for several weeks. She will also benefits from her job and is now [place] for Medicaid. She cannot see her chronic pain doctors due to her losing her benefits. She denies any fevers, vomiting, so anesthesia, weakness.  She does have some long-standing left leg numbness due to sciatica that has been there for weeks. Vitals stable. She does have altered light touch in the left leg reflexes are normal bilaterally. Remainder of exam normal. Exam consistent with her chronic sciatica. Refilled some of her psychiatric medications and given a small pain medicine and given resources to help establish primary care. Stable for discharge.    Elwin MochaBlair Navaya Wiatrek, MD 02/18/14 [phone]

## 2014-06-02 ENCOUNTER — Other Ambulatory Visit: Payer: Self-pay | Admitting: Family Medicine

## 2014-06-04 ENCOUNTER — Other Ambulatory Visit: Payer: Self-pay | Admitting: Family Medicine

## 2014-06-04 DIAGNOSIS — M25512 Pain in left shoulder: Secondary | ICD-10-CM

## 2014-06-25 ENCOUNTER — Ambulatory Visit
Admission: RE | Admit: 2014-06-25 | Discharge: 2014-06-25 | Disposition: A | Discharge status: Home / Self Care | Payer: Medicare Other | Source: Ambulatory Visit | Attending: Family Medicine | Admitting: Family Medicine

## 2014-06-25 DIAGNOSIS — M25512 Pain in left shoulder: Secondary | ICD-10-CM

## 2014-07-08 ENCOUNTER — Other Ambulatory Visit: Payer: Self-pay

## 2014-07-08 DIAGNOSIS — Z1231 Encounter for screening mammogram for malignant neoplasm of breast: Secondary | ICD-10-CM

## 2014-07-14 ENCOUNTER — Ambulatory Visit
Admission: RE | Admit: 2014-07-14 | Discharge: 2014-07-14 | Disposition: A | Discharge status: Home / Self Care | Payer: Medicare Other | Source: Ambulatory Visit

## 2014-07-14 DIAGNOSIS — Z1231 Encounter for screening mammogram for malignant neoplasm of breast: Secondary | ICD-10-CM

## 2014-07-15 ENCOUNTER — Other Ambulatory Visit: Payer: Self-pay | Admitting: Orthopedic Surgery

## 2014-07-17 ENCOUNTER — Other Ambulatory Visit: Payer: Self-pay | Admitting: Orthopedic Surgery

## 2014-07-17 DIAGNOSIS — M25511 Pain in right shoulder: Secondary | ICD-10-CM

## 2014-07-20 ENCOUNTER — Ambulatory Visit
Admission: RE | Admit: 2014-07-20 | Discharge: 2014-07-20 | Disposition: A | Discharge status: Home / Self Care | Payer: Medicare Other | Source: Ambulatory Visit | Attending: Orthopedic Surgery | Admitting: Orthopedic Surgery

## 2014-07-20 DIAGNOSIS — M25511 Pain in right shoulder: Secondary | ICD-10-CM

## 2014-07-25 NOTE — Pre-Procedure Instructions (Signed)
Dawn SneddonLori Ann Chen  07/25/2014   Your procedure is scheduled on:  April 21  [facility] at 11:30 AM.  Call this number if you have problems the morning of surgery: [phone]   Remember:   Do not eat food or drink liquids after midnight.   Take these medicines the morning of surgery with A SIP OF WATER: Wellbutrin, Lexapro, Oxycodone, Zoloft   STOP Vitamin D, Vitamin B12, Ibuprofen, Multiple Vitamins today   STOP/ Do not take Aspirin, Aleve, Naproxen, Advil, Ibuprofen, Motrin, Vitamins, Herbs, or Supplements starting today   Do not wear jewelry, make-up or nail polish.  Do not wear lotions, powders, or perfumes. You may NOT wear deodorant.  Do not shave 48 hours prior to surgery. Men may shave face and neck.  Do not bring valuables to the hospital.  [facility] any belongings or valuables.               Contacts, dentures or bridgework may not be worn into surgery.  Leave suitcase in the car. After surgery it may be brought to your room.  For patients admitted to the hospital, discharge time is determined by your treatment team.               Special Instructions: [place] - Preparing for Surgery  Before surgery, you can play an important role.  Because skin is not sterile, your skin needs to be as free of germs as possible.  You can reduce the number of germs on you skin by washing with CHG (chlorahexidine gluconate) soap before surgery.  CHG is an antiseptic cleaner which kills germs and bonds with the skin to continue killing germs even after washing.  Please DO NOT use if you have an allergy to CHG or antibacterial soaps.  If your skin becomes reddened/irritated stop using the CHG and inform your nurse when you arrive at Short Stay.  Do not shave (including legs and underarms) for at least 48 hours prior to the first CHG shower.  You may shave your face.  Please follow these instructions carefully:   1.  Shower with CHG  Soap the night before surgery and the morning of Surgery.  2.  If you choose to wash your hair, wash your hair first as usual with your normal shampoo.  3.  After you shampoo, rinse your hair and body thoroughly to remove the shampoo.  4.  Use CHG as you would any other liquid soap.  You can apply CHG directly to the skin and wash gently with scrungie or a clean washcloth.  5.  Apply the CHG Soap to your body ONLY FROM THE NECK DOWN.  Do not use on open wounds or open sores.  Avoid contact with your eyes, ears, mouth and genitals (private parts).  Wash genitals (private parts) with your normal soap.  6.  Wash thoroughly, paying special attention to the area where your surgery will be performed.  7.  Thoroughly rinse your body with warm water from the neck down.  8.  DO NOT shower/wash with your normal soap after using and rinsing off the CHG Soap.  9.  Pat yourself dry with a clean towel.            10.  Wear clean pajamas.            11.  Place clean sheets on your bed the night of your first shower and do not sleep  with pets.  Day of Surgery  Do not apply any lotions the morning of surgery.  Please wear clean clothes to the hospital/surgery center.     Please read over the following fact sheets that you were given: Pain Booklet, Coughing and Deep Breathing, Blood Transfusion Information and Surgical Site Infection Prevention

## 2014-07-27 ENCOUNTER — Encounter (HOSPITAL_COMMUNITY)
Admission: RE | Admit: 2014-07-27 | Discharge: 2014-07-27 | Disposition: A | Discharge status: Home / Self Care | Payer: Medicare Other | Source: Ambulatory Visit | Attending: Orthopedic Surgery | Admitting: Orthopedic Surgery

## 2014-07-27 ENCOUNTER — Encounter (HOSPITAL_COMMUNITY): Payer: Self-pay

## 2014-07-27 DIAGNOSIS — Z01818 Encounter for other preprocedural examination: Secondary | ICD-10-CM

## 2014-07-27 HISTORY — DX: Sleep apnea, unspecified: G47.30

## 2014-07-27 HISTORY — DX: Depression, unspecified: F32.A

## 2014-07-27 HISTORY — DX: Carpal tunnel syndrome, unspecified upper limb: G56.00

## 2014-07-27 HISTORY — DX: Anxiety disorder, unspecified: F41.9

## 2014-07-27 HISTORY — DX: Unspecified osteoarthritis, unspecified site: M19.90

## 2014-07-27 HISTORY — DX: Major depressive disorder, single episode, unspecified: F32.9

## 2014-07-27 HISTORY — DX: Other chronic pain: G89.29

## 2014-07-27 LAB — COMPREHENSIVE METABOLIC PANEL
ALT: 35 U/L (ref 0–35)
AST: 31 U/L (ref 0–37)
Albumin: 3.5 g/dL (ref 3.5–5.2)
Alkaline Phosphatase: 111 U/L (ref 39–117)
Anion gap: 7 (ref 5–15)
BUN: 15 mg/dL (ref 6–23)
CO2: 27 mmol/L (ref 19–32)
Calcium: 9.1 mg/dL (ref 8.4–10.5)
Chloride: 106 mmol/L (ref 96–112)
Creatinine, Ser: 0.85 mg/dL (ref 0.50–1.10)
GFR calc Af Amer: 89 mL/min — ABNORMAL LOW (ref >90)
GFR calc non Af Amer: 77 mL/min — ABNORMAL LOW (ref >90)
Glucose, Bld: 83 mg/dL (ref 70–99)
Potassium: 3.7 mmol/L (ref 3.5–5.1)
Sodium: 140 mmol/L (ref 135–145)
Total Bilirubin: 0.3 mg/dL (ref 0.3–1.2)
Total Protein: 6.7 g/dL (ref 6.0–8.3)

## 2014-07-27 LAB — CBC WITH DIFFERENTIAL/PLATELET
Basophils Absolute: 0.1 10*3/uL (ref 0.0–0.1)
Basophils Relative: 1 % (ref 0–1)
Eosinophils Absolute: 0.2 10*3/uL (ref 0.0–0.7)
Eosinophils Relative: 3 % (ref 0–5)
HCT: 34 % — ABNORMAL LOW (ref 36.0–46.0)
Hemoglobin: 11 g/dL — ABNORMAL LOW (ref 12.0–15.0)
Lymphocytes Relative: 47 % — ABNORMAL HIGH (ref 12–46)
Lymphs Abs: 3.1 10*3/uL (ref 0.7–4.0)
MCH: 29.3 pg (ref 26.0–34.0)
MCHC: 32.4 g/dL (ref 30.0–36.0)
MCV: 90.7 fL (ref 78.0–100.0)
Monocytes Absolute: 0.4 10*3/uL (ref 0.1–1.0)
Monocytes Relative: 6 % (ref 3–12)
Neutro Abs: 2.9 10*3/uL (ref 1.7–7.7)
Neutrophils Relative %: 43 % (ref 43–77)
Platelets: 256 10*3/uL (ref 150–400)
RBC: 3.75 MIL/uL — ABNORMAL LOW (ref 3.87–5.11)
RDW: 16.4 % — ABNORMAL HIGH (ref 11.5–15.5)
WBC: 6.6 10*3/uL (ref 4.0–10.5)

## 2014-07-27 LAB — URINALYSIS, ROUTINE W REFLEX MICROSCOPIC
Bilirubin Urine: NEGATIVE
Glucose, UA: NEGATIVE mg/dL
Ketones, ur: NEGATIVE mg/dL
Leukocytes, UA: NEGATIVE
Nitrite: NEGATIVE
Protein, ur: NEGATIVE mg/dL
Specific Gravity, Urine: 1.025 (ref 1.005–1.030)
Urobilinogen, UA: 0.2 mg/dL (ref 0.0–1.0)
pH: 6 (ref 5.0–8.0)

## 2014-07-27 LAB — PROTIME-INR
INR: 0.98 (ref 0.00–1.49)
Prothrombin Time: 13 seconds (ref 11.6–15.2)

## 2014-07-27 LAB — URINE MICROSCOPIC-ADD ON

## 2014-07-27 LAB — TYPE AND SCREEN
ABO/RH(D): A POS
Antibody Screen: NEGATIVE

## 2014-07-27 LAB — APTT: aPTT: 31 seconds (ref 24–37)

## 2014-07-27 LAB — SURGICAL PCR SCREEN
MRSA, PCR: POSITIVE — AB
Staphylococcus aureus: POSITIVE — AB

## 2014-07-27 LAB — ABO/RH: ABO/RH(D): A POS

## 2014-07-27 NOTE — Progress Notes (Signed)
Patient denies chest pain, shob, cardiologist. 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 PCP Dr. Ihor DowNnodi at [facility]. [facility] reports that she had EKG done ~ 2 weeks ago. Also, some confusion with pharmacy per patient on her pain medication. I called [facility] and verified dose, strength, and how prescribed.

## 2014-07-28 NOTE — Progress Notes (Signed)
Call to [facility] requested Office visit note again & EKG, to be faxed to [phone]

## 2014-07-29 MED ORDER — POVIDONE-IODINE 7.5 % EX SOLN
Freq: Once | CUTANEOUS | Status: DC
  Filled 2014-07-29: qty 118

## 2014-07-29 MED ORDER — CEFAZOLIN SODIUM-DEXTROSE 2-3 GM-% IV SOLR
2.0000 g | INTRAVENOUS | Status: AC
  Administered 2014-07-30: 2 g via INTRAVENOUS
  Filled 2014-07-29: qty 50

## 2014-07-30 ENCOUNTER — Inpatient Hospital Stay (HOSPITAL_COMMUNITY): Payer: Medicare Other | Admitting: Anesthesiology

## 2014-07-30 ENCOUNTER — Inpatient Hospital Stay (HOSPITAL_COMMUNITY)
Admission: RE | Admit: 2014-07-30 | Discharge: 2014-07-31 | DRG: 483 | Disposition: A | Discharge status: Home / Self Care | Payer: Medicare Other | Source: Ambulatory Visit | Attending: Orthopedic Surgery | Admitting: Orthopedic Surgery

## 2014-07-30 ENCOUNTER — Encounter (HOSPITAL_COMMUNITY): Payer: Self-pay | Admitting: *Deleted

## 2014-07-30 ENCOUNTER — Encounter (HOSPITAL_COMMUNITY)
Admission: RE | Disposition: A | Discharge status: Home / Self Care | Payer: Self-pay | Source: Ambulatory Visit | Attending: Orthopedic Surgery

## 2014-07-30 ENCOUNTER — Inpatient Hospital Stay (HOSPITAL_COMMUNITY): Payer: Medicare Other

## 2014-07-30 DIAGNOSIS — Z96651 Presence of right artificial knee joint: Secondary | ICD-10-CM | POA: Diagnosis present

## 2014-07-30 DIAGNOSIS — M13811 Other specified arthritis, right shoulder: Principal | ICD-10-CM | POA: Diagnosis present

## 2014-07-30 DIAGNOSIS — F329 Major depressive disorder, single episode, unspecified: Secondary | ICD-10-CM | POA: Diagnosis present

## 2014-07-30 DIAGNOSIS — Z79899 Other long term (current) drug therapy: Secondary | ICD-10-CM | POA: Diagnosis not present

## 2014-07-30 DIAGNOSIS — M19019 Primary osteoarthritis, unspecified shoulder: Secondary | ICD-10-CM | POA: Diagnosis present

## 2014-07-30 DIAGNOSIS — F419 Anxiety disorder, unspecified: Secondary | ICD-10-CM | POA: Diagnosis present

## 2014-07-30 DIAGNOSIS — Z9884 Bariatric surgery status: Secondary | ICD-10-CM | POA: Diagnosis not present

## 2014-07-30 DIAGNOSIS — Z96611 Presence of right artificial shoulder joint: Secondary | ICD-10-CM

## 2014-07-30 DIAGNOSIS — G8929 Other chronic pain: Secondary | ICD-10-CM | POA: Diagnosis present

## 2014-07-30 DIAGNOSIS — F17210 Nicotine dependence, cigarettes, uncomplicated: Secondary | ICD-10-CM | POA: Diagnosis present

## 2014-07-30 DIAGNOSIS — M25511 Pain in right shoulder: Secondary | ICD-10-CM | POA: Diagnosis present

## 2014-07-30 HISTORY — PX: TOTAL SHOULDER ARTHROPLASTY: SHX126

## 2014-07-30 SURGERY — ARTHROPLASTY, SHOULDER, TOTAL
Anesthesia: Regional | Site: Shoulder | Laterality: Right

## 2014-07-30 MED ORDER — SODIUM CHLORIDE 0.9 % IR SOLN
Status: DC | PRN
  Administered 2014-07-30: 1000 mL

## 2014-07-30 MED ORDER — SODIUM CHLORIDE 0.9 % IV SOLN
INTRAVENOUS | Status: DC
  Administered 2014-07-30: 21:00:00 via INTRAVENOUS

## 2014-07-30 MED ORDER — PHENOL 1.4 % MT LIQD: 1.0000 | OROMUCOSAL | Status: DC | PRN

## 2014-07-30 MED ORDER — SERTRALINE HCL 25 MG PO TABS
25.0000 mg | ORAL_TABLET | Freq: Every day | ORAL | Status: DC
  Administered 2014-07-31: 25 mg via ORAL
  Filled 2014-07-30: qty 1

## 2014-07-30 MED ORDER — METOCLOPRAMIDE HCL 5 MG PO TABS: 5.0000 mg | ORAL_TABLET | Freq: Three times a day (TID) | ORAL | Status: DC | PRN

## 2014-07-30 MED ORDER — VANCOMYCIN HCL IN DEXTROSE 1-5 GM/200ML-% IV SOLN
1000.0000 mg | Freq: Once | INTRAVENOUS | Status: AC
  Administered 2014-07-30: 1000 mg via INTRAVENOUS

## 2014-07-30 MED ORDER — FENTANYL CITRATE (PF) 100 MCG/2ML IJ SOLN
INTRAMUSCULAR | Status: DC | PRN
  Administered 2014-07-30: 100 ug via INTRAVENOUS

## 2014-07-30 MED ORDER — LIDOCAINE HCL (CARDIAC) 20 MG/ML IV SOLN
INTRAVENOUS | Status: AC
  Filled 2014-07-30: qty 5

## 2014-07-30 MED ORDER — ONDANSETRON HCL 4 MG/2ML IJ SOLN: 4.0000 mg | Freq: Four times a day (QID) | INTRAMUSCULAR | Status: DC | PRN

## 2014-07-30 MED ORDER — FENTANYL CITRATE (PF) 100 MCG/2ML IJ SOLN
INTRAMUSCULAR | Status: AC
  Filled 2014-07-30: qty 2

## 2014-07-30 MED ORDER — LACTATED RINGERS IV SOLN
INTRAVENOUS | Status: DC
  Administered 2014-07-30 (×2): via INTRAVENOUS

## 2014-07-30 MED ORDER — ONDANSETRON HCL 4 MG PO TABS: 4.0000 mg | ORAL_TABLET | Freq: Four times a day (QID) | ORAL | Status: DC | PRN

## 2014-07-30 MED ORDER — POLYETHYLENE GLYCOL 3350 17 G PO PACK: 17.0000 g | PACK | Freq: Every day | ORAL | Status: DC | PRN

## 2014-07-30 MED ORDER — DOCUSATE SODIUM 100 MG PO CAPS
100.0000 mg | ORAL_CAPSULE | Freq: Two times a day (BID) | ORAL | Status: DC
  Administered 2014-07-30 – 2014-07-31 (×2): 100 mg via ORAL
  Filled 2014-07-30 (×2): qty 1

## 2014-07-30 MED ORDER — DEXAMETHASONE SODIUM PHOSPHATE 4 MG/ML IJ SOLN
INTRAMUSCULAR | Status: AC
  Filled 2014-07-30: qty 2

## 2014-07-30 MED ORDER — PROPOFOL 10 MG/ML IV BOLUS
INTRAVENOUS | Status: DC | PRN
  Administered 2014-07-30: 180 mg via INTRAVENOUS

## 2014-07-30 MED ORDER — MIDAZOLAM HCL 2 MG/2ML IJ SOLN: 2.0000 mg | Freq: Once | INTRAMUSCULAR | Status: DC

## 2014-07-30 MED ORDER — GLYCOPYRROLATE 0.2 MG/ML IJ SOLN
INTRAMUSCULAR | Status: DC | PRN
  Administered 2014-07-30: 0.2 mg via INTRAVENOUS
  Administered 2014-07-30: 0.4 mg via INTRAVENOUS

## 2014-07-30 MED ORDER — MENTHOL 3 MG MT LOZG: 1.0000 | LOZENGE | OROMUCOSAL | Status: DC | PRN

## 2014-07-30 MED ORDER — PROPOFOL 10 MG/ML IV BOLUS
INTRAVENOUS | Status: AC
  Filled 2014-07-30: qty 20

## 2014-07-30 MED ORDER — ONDANSETRON HCL 4 MG/2ML IJ SOLN
INTRAMUSCULAR | Status: AC
  Filled 2014-07-30: qty 2

## 2014-07-30 MED ORDER — ACETAMINOPHEN 500 MG PO TABS
1000.0000 mg | ORAL_TABLET | Freq: Four times a day (QID) | ORAL | Status: DC
  Administered 2014-07-31: 1000 mg via ORAL
  Filled 2014-07-30: qty 2

## 2014-07-30 MED ORDER — ROCURONIUM BROMIDE 100 MG/10ML IV SOLN
INTRAVENOUS | Status: DC | PRN
  Administered 2014-07-30: 50 mg via INTRAVENOUS

## 2014-07-30 MED ORDER — DEXAMETHASONE SODIUM PHOSPHATE 4 MG/ML IJ SOLN
INTRAMUSCULAR | Status: DC | PRN
  Administered 2014-07-30: 8 mg via INTRAVENOUS

## 2014-07-30 MED ORDER — CEFAZOLIN SODIUM-DEXTROSE 2-3 GM-% IV SOLR
2.0000 g | Freq: Four times a day (QID) | INTRAVENOUS | Status: AC
  Administered 2014-07-30 – 2014-07-31 (×3): 2 g via INTRAVENOUS
  Filled 2014-07-30 (×4): qty 50

## 2014-07-30 MED ORDER — METHOCARBAMOL 500 MG PO TABS
500.0000 mg | ORAL_TABLET | Freq: Four times a day (QID) | ORAL | Status: DC | PRN
  Administered 2014-07-31: 500 mg via ORAL
  Filled 2014-07-30: qty 1

## 2014-07-30 MED ORDER — FLEET ENEMA 7-19 GM/118ML RE ENEM: 1.0000 | ENEMA | Freq: Once | RECTAL | Status: AC | PRN

## 2014-07-30 MED ORDER — BISACODYL 5 MG PO TBEC: 5.0000 mg | DELAYED_RELEASE_TABLET | Freq: Every day | ORAL | Status: DC | PRN

## 2014-07-30 MED ORDER — MIDAZOLAM HCL 5 MG/5ML IJ SOLN
INTRAMUSCULAR | Status: DC | PRN
  Administered 2014-07-30: 2 mg via INTRAVENOUS

## 2014-07-30 MED ORDER — DIPHENHYDRAMINE HCL 12.5 MG/5ML PO ELIX: 12.5000 mg | ORAL_SOLUTION | ORAL | Status: DC | PRN

## 2014-07-30 MED ORDER — PHENYLEPHRINE HCL 10 MG/ML IJ SOLN
10.0000 mg | INTRAVENOUS | Status: DC | PRN
  Administered 2014-07-30: 25 ug/min via INTRAVENOUS

## 2014-07-30 MED ORDER — BUPROPION HCL ER (XL) 150 MG PO TB24
150.0000 mg | ORAL_TABLET | Freq: Every day | ORAL | Status: DC
  Administered 2014-07-31: 150 mg via ORAL
  Filled 2014-07-30: qty 1

## 2014-07-30 MED ORDER — VANCOMYCIN HCL IN DEXTROSE 1-5 GM/200ML-% IV SOLN
INTRAVENOUS | Status: AC
  Administered 2014-07-30: 1000 mg via INTRAVENOUS
  Filled 2014-07-30: qty 200

## 2014-07-30 MED ORDER — ESCITALOPRAM OXALATE 10 MG PO TABS
20.0000 mg | ORAL_TABLET | Freq: Every day | ORAL | Status: DC
  Administered 2014-07-31: 20 mg via ORAL
  Filled 2014-07-30: qty 2

## 2014-07-30 MED ORDER — MORPHINE SULFATE 2 MG/ML IJ SOLN: 2.0000 mg | INTRAMUSCULAR | Status: DC | PRN

## 2014-07-30 MED ORDER — MIDAZOLAM HCL 2 MG/2ML IJ SOLN
INTRAMUSCULAR | Status: AC
  Filled 2014-07-30: qty 2

## 2014-07-30 MED ORDER — NEOSTIGMINE METHYLSULFATE 10 MG/10ML IV SOLN
INTRAVENOUS | Status: DC | PRN
  Administered 2014-07-30: 3 mg via INTRAVENOUS

## 2014-07-30 MED ORDER — ALUMINUM HYDROXIDE GEL 320 MG/5ML PO SUSP
15.0000 mL | ORAL | Status: DC | PRN
  Filled 2014-07-30: qty 30

## 2014-07-30 MED ORDER — VANCOMYCIN HCL 1000 MG IV SOLR
1000.0000 mg | INTRAVENOUS | Status: DC | PRN
  Administered 2014-07-30: 1000 mg via INTRAVENOUS

## 2014-07-30 MED ORDER — BUPIVACAINE-EPINEPHRINE (PF) 0.5% -1:200000 IJ SOLN
INTRAMUSCULAR | Status: DC | PRN
  Administered 2014-07-30: 30 mL via PERINEURAL

## 2014-07-30 MED ORDER — HYDROMORPHONE HCL 1 MG/ML IJ SOLN: 0.2500 mg | INTRAMUSCULAR | Status: DC | PRN

## 2014-07-30 MED ORDER — OXYCODONE HCL 5 MG/5ML PO SOLN: 5.0000 mg | Freq: Once | ORAL | Status: DC | PRN

## 2014-07-30 MED ORDER — ZOLPIDEM TARTRATE 5 MG PO TABS: 5.0000 mg | ORAL_TABLET | Freq: Every evening | ORAL | Status: DC | PRN

## 2014-07-30 MED ORDER — QUETIAPINE FUMARATE ER 300 MG PO TB24
600.0000 mg | ORAL_TABLET | Freq: Every day | ORAL | Status: DC
Start: 2014-07-30 — End: 2014-07-31
  Administered 2014-07-30: 600 mg via ORAL
  Filled 2014-07-30 (×2): qty 2

## 2014-07-30 MED ORDER — OXYCODONE HCL 5 MG PO TABS: 5.0000 mg | ORAL_TABLET | Freq: Once | ORAL | Status: DC | PRN

## 2014-07-30 MED ORDER — ONDANSETRON HCL 4 MG/2ML IJ SOLN
INTRAMUSCULAR | Status: DC | PRN
  Administered 2014-07-30: 4 mg via INTRAVENOUS

## 2014-07-30 MED ORDER — MIDAZOLAM HCL 2 MG/2ML IJ SOLN
INTRAMUSCULAR | Status: AC
  Administered 2014-07-30: 2 mg
  Filled 2014-07-30: qty 2

## 2014-07-30 MED ORDER — LIDOCAINE HCL (CARDIAC) 20 MG/ML IV SOLN
INTRAVENOUS | Status: DC | PRN
  Administered 2014-07-30: 50 mg via INTRAVENOUS

## 2014-07-30 MED ORDER — FENTANYL CITRATE (PF) 250 MCG/5ML IJ SOLN
INTRAMUSCULAR | Status: AC
  Filled 2014-07-30: qty 5

## 2014-07-30 MED ORDER — METOCLOPRAMIDE HCL 5 MG/ML IJ SOLN
5.0000 mg | Freq: Three times a day (TID) | INTRAMUSCULAR | Status: DC | PRN
Start: 2014-07-30 — End: 2014-07-31

## 2014-07-30 MED ORDER — OXYCODONE HCL 5 MG PO TABS
5.0000 mg | ORAL_TABLET | ORAL | Status: DC | PRN
  Administered 2014-07-30: 15 mg via ORAL
  Administered 2014-07-31: 20 mg via ORAL
  Administered 2014-07-31 (×2): 15 mg via ORAL
  Filled 2014-07-30: qty 4
  Filled 2014-07-30: qty 3
  Filled 2014-07-30: qty 4
  Filled 2014-07-30: qty 3

## 2014-07-30 MED ORDER — ROCURONIUM BROMIDE 50 MG/5ML IV SOLN
INTRAVENOUS | Status: AC
  Filled 2014-07-30: qty 1

## 2014-07-30 MED ORDER — FENTANYL CITRATE (PF) 100 MCG/2ML IJ SOLN
100.0000 ug | Freq: Once | INTRAMUSCULAR | Status: AC
  Administered 2014-07-30: 100 ug via INTRAVENOUS

## 2014-07-30 MED ORDER — METHOCARBAMOL 1000 MG/10ML IJ SOLN
500.0000 mg | Freq: Four times a day (QID) | INTRAVENOUS | Status: DC | PRN
  Filled 2014-07-30: qty 5

## 2014-07-30 SURGICAL SUPPLY — 67 items
BIT DRILL 5/64X5 DISP (BIT) ×2 IMPLANT
BLADE SAW SAG 73X25 THK (BLADE) ×1
BLADE SAW SGTL 73X25 THK (BLADE) ×1 IMPLANT
BLADE SURG 15 STRL LF DISP TIS (BLADE) ×1 IMPLANT
BLADE SURG 15 STRL SS (BLADE) ×1
CAP SHOULDER TOTAL 2 ×2 IMPLANT
CEMENT HV SMART SET (Cement) ×2 IMPLANT
CHLORAPREP W/TINT 26ML (MISCELLANEOUS) ×2 IMPLANT
CLSR STERI-STRIP ANTIMIC 1/2X4 (GAUZE/BANDAGES/DRESSINGS) ×2 IMPLANT
COVER MAYO STAND STRL (DRAPES) ×2 IMPLANT
COVER SURGICAL LIGHT HANDLE (MISCELLANEOUS) ×2 IMPLANT
DRAPE IMP U-DRAPE 54X76 (DRAPES) ×2 IMPLANT
DRAPE INCISE IOBAN 66X45 STRL (DRAPES) ×4 IMPLANT
DRAPE ORTHO SPLIT 77X108 STRL (DRAPES) ×2
DRAPE SURG 17X23 STRL (DRAPES) ×2 IMPLANT
DRAPE SURG ORHT 6 SPLT 77X108 (DRAPES) ×2 IMPLANT
DRAPE U-SHAPE 47X51 STRL (DRAPES) ×2 IMPLANT
DRSG AQUACEL AG ADV 3.5X10 (GAUZE/BANDAGES/DRESSINGS) ×2 IMPLANT
ELECT BLADE 4.0 EZ CLEAN MEGAD (MISCELLANEOUS) ×2
ELECT REM PT RETURN 9FT ADLT (ELECTROSURGICAL) ×2
ELECTRODE BLDE 4.0 EZ CLN MEGD (MISCELLANEOUS) ×1 IMPLANT
ELECTRODE REM PT RTRN 9FT ADLT (ELECTROSURGICAL) ×1 IMPLANT
EVACUATOR 1/8 PVC DRAIN (DRAIN) IMPLANT
GLOVE BIO SURGEON STRL SZ7 (GLOVE) ×2 IMPLANT
GLOVE BIO SURGEON STRL SZ7.5 (GLOVE) ×2 IMPLANT
GLOVE BIOGEL PI IND STRL 7.0 (GLOVE) ×1 IMPLANT
GLOVE BIOGEL PI IND STRL 8 (GLOVE) ×1 IMPLANT
GLOVE BIOGEL PI INDICATOR 7.0 (GLOVE) ×1
GLOVE BIOGEL PI INDICATOR 8 (GLOVE) ×1
GOWN STRL REUS W/ TWL LRG LVL3 (GOWN DISPOSABLE) ×1 IMPLANT
GOWN STRL REUS W/ TWL XL LVL3 (GOWN DISPOSABLE) ×1 IMPLANT
GOWN STRL REUS W/TWL LRG LVL3 (GOWN DISPOSABLE) ×1
GOWN STRL REUS W/TWL XL LVL3 (GOWN DISPOSABLE) ×1
HANDPIECE INTERPULSE COAX TIP (DISPOSABLE) ×1
HEMOSTAT SURGICEL 2X14 (HEMOSTASIS) ×2 IMPLANT
HOOD PEEL AWAY FACE SHEILD DIS (HOOD) ×4 IMPLANT
KIT BASIN OR (CUSTOM PROCEDURE TRAY) ×2 IMPLANT
KIT ROOM TURNOVER OR (KITS) ×2 IMPLANT
MANIFOLD NEPTUNE II (INSTRUMENTS) ×2 IMPLANT
NEEDLE HYPO 25GX1X1/2 BEV (NEEDLE) ×2 IMPLANT
NEEDLE MAYO TROCAR (NEEDLE) ×2 IMPLANT
NS IRRIG 1000ML POUR BTL (IV SOLUTION) ×2 IMPLANT
PACK SHOULDER (CUSTOM PROCEDURE TRAY) ×2 IMPLANT
PAD ARMBOARD 7.5X6 YLW CONV (MISCELLANEOUS) ×4 IMPLANT
RETRIEVER SUT HEWSON (MISCELLANEOUS) ×2 IMPLANT
SET HNDPC FAN SPRY TIP SCT (DISPOSABLE) ×1 IMPLANT
SLING ARM IMMOBILIZER LRG (SOFTGOODS) ×2 IMPLANT
SLING ARM IMMOBILIZER MED (SOFTGOODS) IMPLANT
SMARTMIX MINI TOWER (MISCELLANEOUS) ×2
SMOOTH ALIGNMENT PIN ×2 IMPLANT
SPONGE LAP 18X18 X RAY DECT (DISPOSABLE) ×2 IMPLANT
SPONGE LAP 4X18 X RAY DECT (DISPOSABLE) ×2 IMPLANT
STRIP CLOSURE SKIN 1/2X4 (GAUZE/BANDAGES/DRESSINGS) ×2 IMPLANT
SUCTION FRAZIER TIP 10 FR DISP (SUCTIONS) ×2 IMPLANT
SUPPORT WRAP ARM LG (MISCELLANEOUS) ×2 IMPLANT
SUT ETHIBOND NAB CT1 #1 30IN (SUTURE) ×2 IMPLANT
SUT MNCRL AB 4-0 PS2 18 (SUTURE) ×2 IMPLANT
SUT SILK 2 0 TIES 17X18 (SUTURE)
SUT SILK 2-0 18XBRD TIE BLK (SUTURE) IMPLANT
SUT VIC AB 0 CTB1 27 (SUTURE) ×2 IMPLANT
SUT VIC AB 2-0 CT1 27 (SUTURE) ×1
SUT VIC AB 2-0 CT1 TAPERPNT 27 (SUTURE) ×1 IMPLANT
SYR CONTROL 10ML LL (SYRINGE) ×2 IMPLANT
TAPE FIBER 2MM 7IN #2 BLUE (SUTURE) ×6 IMPLANT
TOWEL OR 17X24 6PK STRL BLUE (TOWEL DISPOSABLE) ×2 IMPLANT
TOWEL OR 17X26 10 PK STRL BLUE (TOWEL DISPOSABLE) ×2 IMPLANT
TOWER SMARTMIX MINI (MISCELLANEOUS) ×1 IMPLANT

## 2014-07-30 NOTE — Anesthesia Preprocedure Evaluation (Signed)
Anesthesia Evaluation  Patient identified by MRN, date of birth, ID band Patient awake    Reviewed: Allergy & Precautions, NPO status , Patient's Chart, lab work & pertinent test results  Airway Mallampati: II   Neck ROM: full    Dental   Pulmonary Current Smoker,  breath sounds clear to auscultation        Cardiovascular negative cardio ROS  Rhythm:regular Rate:Normal     Neuro/Psych Anxiety Depression    GI/Hepatic   Endo/Other    Renal/GU      Musculoskeletal  (+) Arthritis -, Osteoarthritis,    Abdominal   Peds  Hematology   Anesthesia Other Findings   Reproductive/Obstetrics                             Anesthesia Physical Anesthesia Plan  ASA: II  Anesthesia Plan: General and Regional   Post-op Pain Management: MAC Combined w/ Regional for Post-op pain   Induction: Intravenous  Airway Management Planned: Oral ETT  Additional Equipment:   Intra-op Plan:   Post-operative Plan: Extubation in OR  Informed Consent: I have reviewed the patients History and Physical, chart, labs and discussed the procedure including the risks, benefits and alternatives for the proposed anesthesia with the patient or authorized representative who has indicated his/her understanding and acceptance.     Plan Discussed with: CRNA, Anesthesiologist and Surgeon  Anesthesia Plan Comments:         Anesthesia Quick Evaluation

## 2014-07-30 NOTE — Anesthesia Postprocedure Evaluation (Signed)
  Anesthesia Post-op Note  Patient: Dawn SneddonLori Ann Chen  Procedure(s) Performed: Procedure(s) with comments: TOTAL SHOULDER ARTHROPLASTY (Right) - Right shoulder arthroplasty  Patient Location: PACU  Anesthesia Type:General and GA combined with regional for post-op pain  Level of Consciousness: awake, alert  and oriented  Airway and Oxygen Therapy: Patient Spontanous Breathing and Patient connected to nasal cannula oxygen  Post-op Pain: none  Post-op Assessment: Post-op Vital signs reviewed, Patient's Cardiovascular Status Stable, Respiratory Function Stable, Patent Airway, No signs of Nausea or vomiting and Pain level controlled  Post-op Vital Signs: stable  Last Vitals:  Filed Vitals:   07/30/14 1630  BP: 116/75  Pulse: 81  Temp: 36.6 C  Resp: 13    Complications: No apparent anesthesia complications

## 2014-07-30 NOTE — H&P (Signed)
Dawn Chen is an 54 y.o. female.   Chief Complaint: R shoulder pain and dysfunction HPI: R shoulder severe endstage arthritis failed conservative management with medications and activity modifications.  Declined injection therapy. Pain interferes with sleep and quality of life.  Past Medical History  Diagnosis Date  . Arthritis   . Anemia   . Carpal tunnel syndrome   . Depression   . Anxiety   . Chronic pain   . Sleep apnea     Resolved d/t weight loss  . Osteoarthritis     Past Surgical History  Procedure Laterality Date  . Carpal tunnel release Bilateral 2001  . Hysterotomy  2007  . Shoulder acromioplasty Right 2011  . Joint replacement Right 03/2013    total knee  . Gynecologic cryosurgery    . Abdominal hysterectomy    . Lumbar fusion  2014  . Cholecystectomy    . Gastric bypass  2013    Family History  Problem Relation Age of Onset  . Cancer Maternal Aunt   . Cancer Maternal Grandmother    Social History:  reports that she has been smoking Cigarettes.  She has never used smokeless tobacco. She reports that she does not drink alcohol or use illicit drugs.  Allergies:  Allergies  Allergen Reactions  . Diflucan [Fluconazole] Other (See Comments)    Headache   . Gabapentin     Pass out     Medications Prior to Admission  Medication Sig Dispense Refill  . buPROPion (WELLBUTRIN XL) 150 MG 24 hr tablet Take 1 tablet (150 mg total) by mouth daily. 15 tablet 0  . Cholecalciferol (D3-1000) 1000 UNITS capsule Take 1,000 Units by mouth daily.    . Cyanocobalamin (B-12) 1500 MCG TBCR Take 1,500 mcg by mouth daily. In addition to the    . escitalopram (LEXAPRO) 20 MG tablet Take 20 mg by mouth daily.    Marland Kitchen ibuprofen (ADVIL,MOTRIN) 800 MG tablet Take 800 mg by mouth every 8 (eight) hours as needed for moderate pain.    . Multiple Vitamins-Minerals (MULTIVITAMIN WITH MINERALS) tablet Take 1 tablet by mouth daily.    Marland Kitchen oxyCODONE (ROXICODONE) 15 MG immediate  release tablet Take 15 mg by mouth every 4 (four) hours as needed for pain. Take every 4- 6 hours as needed. Dose and instructions verified with [facility]. RBT    . QUEtiapine (SEROQUEL XR) 300 MG 24 hr tablet Take 2 tablets (600 mg total) by mouth at bedtime. 30 tablet 0  . sertraline (ZOLOFT) 25 MG tablet Take 1 tablet (25 mg total) by mouth daily. 15 tablet 0    No results found for this or any previous visit (from the past 48 hour(s)). No results found.  Review of Systems  All other systems reviewed and are negative.   Blood pressure 152/102, pulse 89, temperature 98.3 F (36.8 C), temperature source Oral, resp. rate 20, height 5' 7.5" (1.715 m), weight 85.458 kg (188 lb 6.4 oz), SpO2 100 %. Physical Exam  Constitutional: She is oriented to person, place, and time. She appears well-developed and well-nourished.  HENT:  Head: Atraumatic.  Eyes: EOM are normal.  Cardiovascular: Intact distal pulses.   Respiratory: Effort normal.  Musculoskeletal:  R shoulder pain with limited motion  Neurological: She is alert and oriented to person, place, and time.  Skin: Skin is warm and dry.  Psychiatric: She has a normal mood and affect.     Assessment/Plan R end stage primary osteoarthritis Plan R total  shoulder replacement Risks / benefits of surgery discussed Consent on chart  NPO for OR Preop antibiotics   Marithza Malachi WILLIAM 07/30/2014, 12:58 PM

## 2014-07-30 NOTE — Transfer of Care (Signed)
Immediate Anesthesia Transfer of Care Note  Patient: Dawn SneddonLori Ann Chen  Procedure(s) Performed: Procedure(s) with comments: TOTAL SHOULDER ARTHROPLASTY (Right) - Right shoulder arthroplasty  Patient Location: PACU  Anesthesia Type:General and Regional  Level of Consciousness: awake, alert , oriented and patient cooperative  Airway & Oxygen Therapy: Patient Spontanous Breathing and Patient connected to nasal cannula oxygen  Post-op Assessment: Report given to RN and Post -op Vital signs reviewed and stable  Post vital signs: Reviewed and stable  Last Vitals:  Filed Vitals:   07/30/14 1307  BP: 133/71  Pulse: 89  Temp:   Resp: 20    Complications: No apparent anesthesia complications

## 2014-07-30 NOTE — Op Note (Signed)
Procedure(s): TOTAL SHOULDER ARTHROPLASTY Procedure Note  Dawn Chen female 54 y.o. 07/30/2014  Procedure(s) and Anesthesia Type:    * TOTAL SHOULDER ARTHROPLASTY - Choice  Surgeon(s) and Role:    * Jones Broom, MD - Primary   Indications:  54 y.o. female  With endstage right shoulder arthritis. Pain and dysfunction interfered with quality of life and nonoperative treatment with activity modification, NSAIDS and injections failed.     Surgeon: Mable Paris   Assistants: Damita Lack PA-C [facility] was present and scrubbed throughout the procedure and was essential in positioning, retraction, exposure, and closure)  Anesthesia: General endotracheal anesthesia with preoperative interscalene block given by the attending anesthesiologist     Procedure Detail  TOTAL SHOULDER ARTHROPLASTY  Findings: Tornier flex anatomic press-fit size 4 stem with a 48x18 eccentric head, cemented size small Cortiloc glenoid.   A lesser tuberosity osteotomy was performed and repaired at the conclusion of the procedure.  Estimated Blood Loss:  200 mL         Drains: None   Blood Given: none          Specimens: none        Complications:  * No complications entered in OR log *         Disposition: PACU - hemodynamically stable.         Condition: stable    Procedure:   The patient was identified in the preoperative holding area where I personally marked the operative extremity after verifying with the patient and consent. She  was taken to the operating room where She was transferred to the   operative table.  The patient received an interscalene block in   the holding area by the attending anesthesiologist.  General anesthesia was induced   in the operating room without complication.  The patient did receive IV  Ancef prior to the commencement of the procedure.  The patient was   placed in the beach-chair position with the back raised about 30   degrees.  The  nonoperative extremity and head and neck were carefully   positioned and padded protecting against neurovascular compromise.  The   left upper extremity was then prepped and draped in the standard sterile   fashion.    The appropriate operative time-out was performed with   Anesthesia, the perioperative staff, as well as myself and we all agreed   that the right side was the correct operative site.  An approximately   10 cm incision was made from the tip of the coracoid to the center point of the   humerus at the level of the axilla.  Dissection was carried down sharply   through subcutaneous tissues and cephalic vein was identified and taken   laterally with the deltoid.  The pectoralis major was taken medially.  She was noted to have an extensive amount of adhesions in the intermuscular planes and beneath the deltoid. The   upper 1 cm of the pectoralis major was released from its attachment on   the humerus.  The clavipectoral fascia was incised just lateral to the   conjoined tendon.  This incision was carried up to but not into the   coracoacromial ligament.  Digital palpation was used to prove   integrity of the axillary nerve which was protected throughout the   procedure.  Musculocutaneous nerve was not palpated in the operative   field.  Conjoined tendon was then retracted gently medially and the   deltoid laterally.  Anterior  circumflex humeral vessels were clamped and   coagulated.  The soft tissues overlying the biceps was incised and this   incision was carried across the transverse humeral ligament to the base   of the coracoid.  The biceps was tenodesed to the soft tissue just above   pectoralis major and the remaining portion of the biceps superiorly was   excised.  An osteotomy was performed at the lesser tuberosity.  Capsule was then   released all the way down to the 6 o'clock position of the humeral head.   The humeral head was then delivered with simultaneous adduction,    extension and external rotation.  All humeral osteophytes were removed   and the anatomic neck of the humerus was marked and cut free hand at   approximately 25 degrees retroversion within about 3 mm of the cuff   reflection posteriorly.  The head size was estimated to be a 48 medium   offset.  At that point, the humeral head was retracted posteriorly with   a Fukuda retractor and the anterior-inferior capsule was excised.   Remaining portion of the capsule was released at the base of the   coracoid.  The remaining biceps anchor and the entire anterior-inferior   labrum was excised.  The posterior labrum was also excised but the   posterior capsule was not released.  The guidepin was placed bicortically with 5 elevated guide.  The reamer was used to ream to concentric bone with punctate bleeding.  This gave an excellent concentric surface.  The center hole was then drilled for an anchor peg glenoid followed by the three peripheral holes and none of the holes   exited the glenoid wall.  I then pulse irrigated these holes and dried   them with Surgicel.  The three peripheral holes were then   pressurized cemented and the anchor peg glenoid was placed and impacted   with an excellent fit.  The glenoid was a small component.  The proximal humerus was then again exposed taking care not to displace the glenoid.    The entry awl was used followed by sounding reamers and then sequentially broached from size 2 to 4. This was then left in place and the calcar planer was used. Trial head was placed with a 48 x 18 eccentric.  With the trial implantation of the component,  there was approximately 50% posterior translation with immediate snap back to the   anatomic position.  With forward elevation, there was no tendency   towards posterior subluxation.   The trial was removed and the final implant was prepared on a back table.  The trial was removed and the final implant was prepared on a back table.   Small  holes were drilled on both sides of the lesser tuberosity osteotomy, through which 3 Fibertapes were passed. The implant was then placed through the loop of all 3 Fibertapes and impacted with an excellent press-fit. This achieved excellent anatomic reconstruction of the proximal humerus.  The joint was then copiously irrigated with pulse lavage.  The subscapularis and   lesser tuberosity osteotomy were then repaired using the 3 Fibertapes previously passed.   One #1 Ethibond was placed at the rotator interval just above   the lesser tuberosity. Copious irrigation was used. Skin was closed with 2-0 Vicryl sutures in the deep dermal layer and 4-0 Monocryl in a subcuticular  running fashion.  Sterile dressings were then applied including Aquacel.  The patient was placed in  a sling and allowed to awaken from general anesthesia and taken to the recovery room in stable condition.      POSTOPERATIVE PLAN:  Early passive range of motion will be allowed with the goal of 40 degrees external rotation and 140 degrees forward elevation.  No internal rotation at this time.  No active motion of the arm until the lesser tuberosity heals.  The patient will likely be kept in the hospital for 1-2 days and then discharged home.

## 2014-07-30 NOTE — Anesthesia Procedure Notes (Signed)
Anesthesia Regional Block:  Interscalene brachial plexus block  Pre-Anesthetic Checklist: ,, timeout performed, Correct Patient, Correct Site, Correct Laterality, Correct Procedure, Correct Position, site marked, Risks and benefits discussed,  Surgical consent,  Pre-op evaluation,  At surgeon's request and post-op pain management  Laterality: Right  Prep: chloraprep       Needles:  Injection technique: Single-shot  Needle Type: Echogenic Stimulator Needle     Needle Length: 5cm 5 cm Needle Gauge: 22 and 22 G    Additional Needles:  Procedures: ultrasound guided (picture in chart) and nerve stimulator Interscalene brachial plexus block  Nerve Stimulator or Paresthesia:  Response: biceps flexion, 0.45 mA,   Additional Responses:   Narrative:  Start time: 07/30/2014 12:56 PM End time: 07/30/2014 1:06 PM Injection made incrementally with aspirations every 5 mL.  Performed by: Personally  Anesthesiologist: Jacquita Mulhearn  Additional Notes: Functioning IV was confirmed and monitors were applied.  A 50mm 22ga Arrow echogenic stimulator needle was used. Sterile prep and drape,hand hygiene and sterile gloves were used.  Negative aspiration and negative test dose prior to incremental administration of local anesthetic. The patient tolerated the procedure well.  Ultrasound guidance: relevent anatomy identified, needle position confirmed, local anesthetic spread visualized around nerve(s), vascular puncture avoided.  Image printed for medical record.

## 2014-07-31 LAB — CBC
HCT: 29.4 % — ABNORMAL LOW (ref 36.0–46.0)
Hemoglobin: 9.4 g/dL — ABNORMAL LOW (ref 12.0–15.0)
MCH: 28.3 pg (ref 26.0–34.0)
MCHC: 32 g/dL (ref 30.0–36.0)
MCV: 88.6 fL (ref 78.0–100.0)
Platelets: 240 10*3/uL (ref 150–400)
RBC: 3.32 MIL/uL — ABNORMAL LOW (ref 3.87–5.11)
RDW: 16.4 % — ABNORMAL HIGH (ref 11.5–15.5)
WBC: 10.1 10*3/uL (ref 4.0–10.5)

## 2014-07-31 LAB — BASIC METABOLIC PANEL
Anion gap: 9 (ref 5–15)
BUN: 14 mg/dL (ref 6–23)
CO2: 25 mmol/L (ref 19–32)
Calcium: 8.8 mg/dL (ref 8.4–10.5)
Chloride: 106 mmol/L (ref 96–112)
Creatinine, Ser: 0.79 mg/dL (ref 0.50–1.10)
GFR calc Af Amer: >90 mL/min (ref >90)
GFR calc non Af Amer: >90 mL/min (ref >90)
Glucose, Bld: 95 mg/dL (ref 70–99)
Potassium: 4 mmol/L (ref 3.5–5.1)
Sodium: 140 mmol/L (ref 135–145)

## 2014-07-31 MED ORDER — OXYCODONE HCL 5 MG PO TABS: ORAL_TABLET | ORAL | Status: DC

## 2014-07-31 MED ORDER — DOCUSATE SODIUM 100 MG PO CAPS: 100.0000 mg | ORAL_CAPSULE | Freq: Three times a day (TID) | ORAL | Status: DC | PRN

## 2014-07-31 MED ORDER — MUPIROCIN 2 % EX OINT
1.0000 "application " | TOPICAL_OINTMENT | Freq: Two times a day (BID) | CUTANEOUS | Status: DC
  Administered 2014-07-31: 1 via NASAL
  Filled 2014-07-31: qty 22

## 2014-07-31 MED ORDER — CHLORHEXIDINE GLUCONATE CLOTH 2 % EX PADS: 6.0000 | MEDICATED_PAD | Freq: Every day | CUTANEOUS | Status: DC

## 2014-07-31 NOTE — Progress Notes (Signed)
   PATIENT ID: Dawn Chen   1 Day Post-Op Procedure(s) (LRB): TOTAL SHOULDER ARTHROPLASTY (Right)  Subjective: Reports doing well, minimal pain. Believes block has worn off. No complaints or concerns.  Objective:  Filed Vitals:   07/31/14 0543  BP: 119/78  Pulse: 81  Temp: 98.6 F (37 C)  Resp: 18     R UE dressing with spotted dry blood.  Wiggles fingers, distally NVI Mild hand swelling  Labs:   Recent Labs  07/31/14 0645  HGB 9.4*   Recent Labs  07/31/14 0645  WBC 10.1  RBC 3.32*  HCT 29.4*  PLT 240   Recent Labs  07/31/14 0645  NA 140  K 4.0  CL 106  CO2 25  BUN 14  CREATININE 0.79  GLUCOSE 95  CALCIUM 8.8    Assessment and Plan: 1 day s/p R TSA Sling OT- hand wrist elbow, PROM to 140FF, 40ER Okay to d/c home after cleared by OT today Scripts in chart  [address] Filter[facility] in 2 weeks  VTE proph: ASA 325mg  BID, SCDs

## 2014-07-31 NOTE — Evaluation (Signed)
Occupational Therapy Evaluation Patient Details Name: Dawn SneddonLori Ann Chen MRN: [id number] DOB: 1960/11/06 Today's Date: 07/31/2014    History of Present Illness 54 yo F s/p R TSA   Clinical Impression   Patient admitted with above. Patient independent PTA. Patient currently functioning at up to a min assist level secondary to immobilization > RUE secondary to recent R TSA. Patient states her husband and non-relative friend will be available to assist post acute d/c. Patient compliant with precautions and restrictions and believe she will be able to manage at home with intermittent supervision. D/C from acute OT services and no additional follow-up OT needs at this time. All appropriate education provided to patient. Please re-order OT if needed.      Follow Up Recommendations  No OT follow up;Supervision - Intermittent    Equipment Recommendations  None recommended by OT    Recommendations for Other Services  None at this time     Precautions / Restrictions Precautions Precautions: Shoulder Type of Shoulder Precautions: No AROM, Non weight bearing, FF 0-140*, ER 0-40*, sling on at all times except during B/D and exercises  Shoulder Interventions: Shoulder sling/immobilizer;Off for dressing/bathing/exercises;At all times Required Braces or Orthoses: Sling Restrictions Weight Bearing Restrictions: Yes RUE Weight Bearing: Non weight bearing      Mobility Bed Mobility Overal bed mobility: Independent  General bed mobility comments: sling donned  Transfers Overall transfer level: Independent   General transfer comment: sling donned    Balance Overall balance assessment: No apparent balance deficits (not formally assessed)    ADL Overall ADL's : Needs assistance/impaired General ADL Comments: Patient requires up to min assist for tasks secondary to right shoulder immobilization. Taught patient compensatory techniques to increase independence with ADL tasks. Patient states her  husband will be available to assist with this post acute d/c. Patient compliant with all precautions and restrictions.     Pertinent Vitals/Pain Pain Assessment: 0-10 Pain Score: 5  Pain Location: RUE Pain Descriptors / Indicators: Aching Pain Intervention(s): Monitored during session;Premedicated before session     Hand Dominance Right   Extremity/Trunk Assessment Upper Extremity Assessment Upper Extremity Assessment: RUE deficits/detail RUE Deficits / Details: s/p R TSA RUE: Unable to fully assess due to immobilization   Lower Extremity Assessment Lower Extremity Assessment: Overall WFL for tasks assessed   Cervical / Trunk Assessment Cervical / Trunk Assessment: Normal   Communication Communication Communication: No difficulties   Cognition Arousal/Alertness: Awake/alert Behavior During Therapy: WFL for tasks assessed/performed Overall Cognitive Status: Within Functional Limits for tasks assessed         Shoulder Instructions Shoulder Instructions Donning/doffing shirt without moving shoulder: Supervision/safety;Patient able to independently direct caregiver Method for sponge bathing under operated UE: Supervision/safety;Patient able to independently direct caregiver Donning/doffing sling/immobilizer: Supervision/safety;Patient able to independently direct caregiver Correct positioning of sling/immobilizer: Supervision/safety;Patient able to independently direct caregiver Pendulum exercises (written home exercise program): Supervision/safety;Patient able to independently direct caregiver ROM for elbow, wrist and digits of operated UE: Supervision/safety;Patient able to independently direct caregiver Sling wearing schedule (on at all times/off for ADL's): Independent Proper positioning of operated UE when showering: Supervision/safety;Patient able to independently direct caregiver Positioning of UE while sleeping: Supervision/safety;Patient able to independently direct  caregiver    Home Living Family/patient expects to be discharged to:: Private residence Living Arrangements: Spouse/significant other;Non-relatives/Friends Available Help at Discharge: Family;Friend(s);Available PRN/intermittently   Bathroom Shower/Tub: Tub/shower unit   Bathroom Toilet: Standard     Home Equipment: Hand held shower head    Prior Functioning/Environment Level of  Independence: Independent      OT Diagnosis:  n/a, no further acute OT needs   OT Problem List:   n/a, no further acute OT needs   OT Treatment/Interventions:   n/a, no further acute OT needs   OT Goals(Current goals can be found in the care plan section) Acute Rehab OT Goals Patient Stated Goal: go home today  OT Frequency:   n/a, no further acute OT needs   Barriers to D/C:  None known at this time   End of Session Equipment Utilized During Treatment: Other (comment) (sling>RUE)  Activity Tolerance: Patient tolerated treatment well Patient left: in chair;with call bell/phone within reach   Time: 0810-0852 OT Time Calculation (min): 42 min Charges:  OT General Charges $OT Visit: 1 Procedure OT Evaluation $Initial OT Evaluation Tier I: 1 Procedure OT Treatments $Self Care/Home Management : 8-22 mins $Therapeutic Exercise: 8-22 mins  Dawn Chen , MS, OTR/L, CLT Pager: [phone]  07/31/2014, 9:08 AM

## 2014-07-31 NOTE — Progress Notes (Signed)
Utilization review completed.  

## 2014-07-31 NOTE — Discharge Instructions (Signed)

## 2014-08-01 NOTE — Discharge Summary (Signed)
Patient ID: Dawn SneddonLori Ann Chen MRN: [id number] DOB/AGE: [birth year] 54 y.o.  Admit date: 07/30/2014 Discharge date: 07/31/2014  Admission Diagnoses:  Active Problems:   Glenohumeral arthritis   Discharge Diagnoses:  Same  Past Medical History  Diagnosis Date  . Arthritis   . Anemia   . Carpal tunnel syndrome   . Depression   . Anxiety   . Chronic pain   . Sleep apnea     Resolved d/t weight loss  . Osteoarthritis     Surgeries: Procedure(s): TOTAL SHOULDER ARTHROPLASTY on 07/30/2014   Consultants:    Discharged Condition: Improved  Hospital Course: Dawn SneddonLori Ann Chen is an 54 y.o. female who was admitted 07/30/2014 for operative treatment of right glenohumeral arthritis. Patient has severe unremitting pain that affects sleep, daily activities, and work/hobbies. After pre-op clearance the patient was taken to the operating room on 07/30/2014 and underwent  Procedure(s): TOTAL SHOULDER ARTHROPLASTY.    Patient was given perioperative antibiotics: Anti-infectives    Start     Dose/Rate Route Frequency Ordered Stop   07/30/14 2000  ceFAZolin (ANCEF) IVPB 2 g/50 mL premix     2 g 100 mL/hr over 30 Minutes Intravenous Every 6 hours 07/30/14 1753 07/31/14 1020   07/30/14 1315  vancomycin (VANCOCIN) IVPB 1000 mg/200 mL premix     1,000 mg 200 mL/hr over 60 Minutes Intravenous  Once 07/30/14 1309 07/30/14 1423   07/30/14 0600  ceFAZolin (ANCEF) IVPB 2 g/50 mL premix     2 g 100 mL/hr over 30 Minutes Intravenous On call to O.R. 07/29/14 1413 07/30/14 1430       Patient was given sequential compression devices, early ambulation, and ASA 325 mg BID to prevent DVT.  Patient benefited maximally from hospital stay and there were no complications.    Recent vital signs: No data found.    Recent laboratory studies:  Recent Labs  07/31/14 0645  WBC 10.1  HGB 9.4*  HCT 29.4*  PLT 240  NA 140  K 4.0  CL 106  CO2 25  BUN 14  CREATININE 0.79  GLUCOSE 95  CALCIUM 8.8      Discharge Medications:     Medication List    STOP taking these medications        ibuprofen 800 MG tablet  Commonly known as:  ADVIL,MOTRIN      TAKE these medications        B-12 1500 MCG Tbcr  Take 1,500 mcg by mouth daily. In addition to the 1000mcg     buPROPion 150 MG 24 hr tablet  Commonly known as:  WELLBUTRIN XL  Take 1 tablet (150 mg total) by mouth daily.     D3-1000 1000 UNITS capsule  Generic drug:  Cholecalciferol  Take 1,000 Units by mouth daily.     docusate sodium 100 MG capsule  Commonly known as:  COLACE  Take 1 capsule (100 mg total) by mouth 3 (three) times daily as needed.     escitalopram 20 MG tablet  Commonly known as:  LEXAPRO  Take 20 mg by mouth daily.     multivitamin with minerals tablet  Take 1 tablet by mouth daily.     oxyCODONE 15 MG immediate release tablet  Commonly known as:  ROXICODONE  Take 15 mg by mouth every 4 (four) hours as needed for pain. Take every 4- 6 hours as needed. Dose and instructions verified with [facility]. RBT     oxyCODONE 5 MG immediate release tablet  Commonly  known as:  ROXICODONE  1-3 q 3-4 hrs prn pain     QUEtiapine 300 MG 24 hr tablet  Commonly known as:  SEROQUEL XR  Take 2 tablets (600 mg total) by mouth at bedtime.     sertraline 25 MG tablet  Commonly known as:  ZOLOFT  Take 1 tablet (25 mg total) by mouth daily.        Diagnostic Studies: Dg Chest 2 View  07/27/2014   CLINICAL DATA:  Preop chest x-ray for total shoulder arthroplasty on 07/30/2014.  EXAM: CHEST  2 VIEW  COMPARISON:  None.  FINDINGS: The heart size and mediastinal contours are within normal limits. Both lungs are clear. The visualized skeletal structures are unremarkable.  IMPRESSION: No active cardiopulmonary disease.   Electronically Signed   By: Elige Ko   On: 07/27/2014 10:54   Ct Shoulder Right Wo Contrast  07/20/2014   CLINICAL DATA:  Right shoulder pain for 4-5 months. Acromioplasty in 2010.  EXAM: CT OF THE RIGHT  SHOULDER WITHOUT CONTRAST  TECHNIQUE: Multidetector CT imaging was performed according to the standard protocol. Multiplanar CT image reconstructions were also generated.  COMPARISON:  None.  FINDINGS: There is severe osteoarthritis of the glenohumeral joint with multiple subcortical cysts as well as osteophytes on the glenoid and humeral head. There is a prominent effusion in the glenohumeral joint. Minimal degenerative changes of the [facility] joint. Prior acromioplasty. Os acromiale.  The muscles of the rotator cuff appear normal. No appreciable rotator cuff tear.  IMPRESSION: Severe osteoarthritis of the glenohumeral joint. Prominent joint effusion. Os acromiale.   Electronically Signed   By: Francene Boyers M.D.   On: 07/20/2014 11:44   Dg Shoulder Right Port  07/30/2014   CLINICAL DATA:  Status post right shoulder replacement  EXAM: PORTABLE RIGHT SHOULDER - 2+ VIEW  COMPARISON:  None.  FINDINGS: Right shoulder replacement is noted. No acute abnormality is seen. Mild degenerative changes of the acromioclavicular joint are noted. The underlying bony thorax is unremarkable.  IMPRESSION: Status post right shoulder replacement.   Electronically Signed   By: Alcide Clever M.D.   On: 07/30/2014 16:43   Mm Digital Screening Bilateral  07/14/2014   CLINICAL DATA:  Screening.  EXAM: DIGITAL SCREENING BILATERAL MAMMOGRAM WITH CAD  COMPARISON:  Previous exam(s).  ACR Breast Density Category b: There are scattered areas of fibroglandular density.  FINDINGS: There are no findings suspicious for malignancy. Images were processed with CAD.  IMPRESSION: No mammographic evidence of malignancy. A result letter of this screening mammogram will be mailed directly to the patient.  RECOMMENDATION: Screening mammogram in one year. (Code:[id number]-B-01Y)  BI-RADS CATEGORY  1: Negative.   Electronically Signed   By: Norva Pavlov M.D.   On: 07/14/2014 14:37    Disposition: 01-Home or Self Care      Discharge Instructions    Call MD /  Call 911    Complete by:  As directed   If you experience chest pain or shortness of breath, CALL 911 and be transported to the hospital emergency room.  If you develope a fever above 101 F, pus (white drainage) or increased drainage or redness at the wound, or calf pain, call your surgeon's office.     Constipation Prevention    Complete by:  As directed   Drink plenty of fluids.  Prune juice may be helpful.  You may use a stool softener, such as Colace (over the counter) 100 mg twice a day.  Use MiraLax (  over the counter) for constipation as needed.     Diet - low sodium heart healthy    Complete by:  As directed      Increase activity slowly as tolerated    Complete by:  As directed            Follow-up Information    Follow up with Mable Paris, MD. Schedule an appointment as soon as possible for a visit in 2 weeks.   Specialty:  Orthopedic Surgery   Contact information:   [address] [place] [address] [phone]        Signed: Jiles Harold 08/01/2014, 8:51 AM

## 2014-08-03 ENCOUNTER — Encounter (HOSPITAL_COMMUNITY): Payer: Self-pay | Admitting: Orthopedic Surgery

## 2014-08-17 ENCOUNTER — Encounter (HOSPITAL_COMMUNITY): Payer: Self-pay | Admitting: *Deleted

## 2014-08-17 ENCOUNTER — Other Ambulatory Visit: Payer: Self-pay | Admitting: Orthopedic Surgery

## 2014-08-17 MED ORDER — CEFAZOLIN SODIUM-DEXTROSE 2-3 GM-% IV SOLR: 2.0000 g | INTRAVENOUS | Status: DC

## 2014-08-17 NOTE — Progress Notes (Signed)
Pt denies SOB, chest pain, and being under the care of a cardiologist. Pt made aware to not take any NSAID's, otc vitamins and herbal medications. Pt verbalized understanding of all pre-op instructions.

## 2014-08-18 ENCOUNTER — Ambulatory Visit (HOSPITAL_COMMUNITY): Payer: Medicare Other | Admitting: Anesthesiology

## 2014-08-18 ENCOUNTER — Encounter (HOSPITAL_COMMUNITY)
Admission: RE | Disposition: A | Discharge status: Home / Self Care | Payer: Self-pay | Source: Ambulatory Visit | Attending: Orthopedic Surgery

## 2014-08-18 ENCOUNTER — Ambulatory Visit (HOSPITAL_COMMUNITY)
Admission: RE | Admit: 2014-08-18 | Discharge: 2014-08-18 | Disposition: A | Discharge status: Home / Self Care | Payer: Medicare Other | Source: Ambulatory Visit | Attending: Orthopedic Surgery | Admitting: Orthopedic Surgery

## 2014-08-18 ENCOUNTER — Encounter (HOSPITAL_COMMUNITY): Payer: Self-pay | Admitting: *Deleted

## 2014-08-18 DIAGNOSIS — F419 Anxiety disorder, unspecified: Secondary | ICD-10-CM | POA: Diagnosis not present

## 2014-08-18 DIAGNOSIS — F17210 Nicotine dependence, cigarettes, uncomplicated: Secondary | ICD-10-CM | POA: Diagnosis not present

## 2014-08-18 DIAGNOSIS — Z981 Arthrodesis status: Secondary | ICD-10-CM | POA: Diagnosis not present

## 2014-08-18 DIAGNOSIS — M75101 Unspecified rotator cuff tear or rupture of right shoulder, not specified as traumatic: Secondary | ICD-10-CM | POA: Diagnosis not present

## 2014-08-18 DIAGNOSIS — Z888 Allergy status to other drugs, medicaments and biological substances status: Secondary | ICD-10-CM | POA: Insufficient documentation

## 2014-08-18 DIAGNOSIS — Z96651 Presence of right artificial knee joint: Secondary | ICD-10-CM | POA: Diagnosis not present

## 2014-08-18 DIAGNOSIS — Z9119 Patient's noncompliance with other medical treatment and regimen: Secondary | ICD-10-CM | POA: Diagnosis not present

## 2014-08-18 DIAGNOSIS — M1990 Unspecified osteoarthritis, unspecified site: Secondary | ICD-10-CM | POA: Diagnosis not present

## 2014-08-18 DIAGNOSIS — Z9114 Patient's other noncompliance with medication regimen: Secondary | ICD-10-CM | POA: Diagnosis not present

## 2014-08-18 DIAGNOSIS — Z9049 Acquired absence of other specified parts of digestive tract: Secondary | ICD-10-CM | POA: Diagnosis not present

## 2014-08-18 DIAGNOSIS — Z90710 Acquired absence of both cervix and uterus: Secondary | ICD-10-CM | POA: Insufficient documentation

## 2014-08-18 DIAGNOSIS — Z96611 Presence of right artificial shoulder joint: Secondary | ICD-10-CM | POA: Insufficient documentation

## 2014-08-18 DIAGNOSIS — F329 Major depressive disorder, single episode, unspecified: Secondary | ICD-10-CM | POA: Insufficient documentation

## 2014-08-18 DIAGNOSIS — D649 Anemia, unspecified: Secondary | ICD-10-CM | POA: Insufficient documentation

## 2014-08-18 HISTORY — DX: Disorder of muscle, unspecified: M62.9

## 2014-08-18 HISTORY — PX: SHOULDER ARTHROSCOPY WITH ROTATOR CUFF REPAIR: SHX5685

## 2014-08-18 LAB — BASIC METABOLIC PANEL
Anion gap: 12 (ref 5–15)
BUN: 8 mg/dL (ref 6–20)
CO2: 27 mmol/L (ref 22–32)
Calcium: 9.2 mg/dL (ref 8.9–10.3)
Chloride: 102 mmol/L (ref 101–111)
Creatinine, Ser: 0.73 mg/dL (ref 0.44–1.00)
GFR calc Af Amer: >60 mL/min (ref >60)
GFR calc non Af Amer: >60 mL/min (ref >60)
Glucose, Bld: 107 mg/dL — ABNORMAL HIGH (ref 70–99)
Potassium: 3.4 mmol/L — ABNORMAL LOW (ref 3.5–5.1)
Sodium: 141 mmol/L (ref 135–145)

## 2014-08-18 LAB — CBC
HCT: 30.6 % — ABNORMAL LOW (ref 36.0–46.0)
Hemoglobin: 10.1 g/dL — ABNORMAL LOW (ref 12.0–15.0)
MCH: 28.7 pg (ref 26.0–34.0)
MCHC: 33 g/dL (ref 30.0–36.0)
MCV: 86.9 fL (ref 78.0–100.0)
Platelets: 529 10*3/uL — ABNORMAL HIGH (ref 150–400)
RBC: 3.52 MIL/uL — ABNORMAL LOW (ref 3.87–5.11)
RDW: 16.7 % — ABNORMAL HIGH (ref 11.5–15.5)
WBC: 7.8 10*3/uL (ref 4.0–10.5)

## 2014-08-18 LAB — GRAM STAIN

## 2014-08-18 LAB — HCG, SERUM, QUALITATIVE: Preg, Serum: NEGATIVE

## 2014-08-18 SURGERY — ARTHROSCOPY, SHOULDER, WITH ROTATOR CUFF REPAIR
Anesthesia: General | Site: Shoulder | Laterality: Right

## 2014-08-18 MED ORDER — POVIDONE-IODINE 7.5 % EX SOLN
Freq: Once | CUTANEOUS | Status: DC
  Filled 2014-08-18: qty 118

## 2014-08-18 MED ORDER — SUCCINYLCHOLINE CHLORIDE 20 MG/ML IJ SOLN
INTRAMUSCULAR | Status: DC | PRN
  Administered 2014-08-18: 100 mg via INTRAVENOUS

## 2014-08-18 MED ORDER — VANCOMYCIN HCL IN DEXTROSE 1-5 GM/200ML-% IV SOLN
1000.0000 mg | INTRAVENOUS | Status: AC
  Administered 2014-08-18: 1000 mg via INTRAVENOUS
  Filled 2014-08-18: qty 200

## 2014-08-18 MED ORDER — OXYCODONE HCL 5 MG PO TABS
5.0000 mg | ORAL_TABLET | Freq: Once | ORAL | Status: AC | PRN
  Administered 2014-08-18: 5 mg via ORAL

## 2014-08-18 MED ORDER — HYDROMORPHONE HCL 1 MG/ML IJ SOLN
INTRAMUSCULAR | Status: AC
  Administered 2014-08-18: 0.5 mg via INTRAVENOUS
  Filled 2014-08-18: qty 1

## 2014-08-18 MED ORDER — ONDANSETRON HCL 4 MG/2ML IJ SOLN
INTRAMUSCULAR | Status: AC
  Filled 2014-08-18: qty 2

## 2014-08-18 MED ORDER — EPHEDRINE SULFATE 50 MG/ML IJ SOLN
INTRAMUSCULAR | Status: DC | PRN
  Administered 2014-08-18 (×2): 10 mg via INTRAVENOUS

## 2014-08-18 MED ORDER — PHENYLEPHRINE HCL 10 MG/ML IJ SOLN
INTRAMUSCULAR | Status: DC | PRN
  Administered 2014-08-18 (×2): 80 ug via INTRAVENOUS
  Administered 2014-08-18: 120 ug via INTRAVENOUS

## 2014-08-18 MED ORDER — ROCURONIUM BROMIDE 50 MG/5ML IV SOLN
INTRAVENOUS | Status: AC
  Filled 2014-08-18: qty 1

## 2014-08-18 MED ORDER — MIDAZOLAM HCL 2 MG/2ML IJ SOLN
INTRAMUSCULAR | Status: AC
  Administered 2014-08-18: 2 mg
  Filled 2014-08-18: qty 2

## 2014-08-18 MED ORDER — BUPIVACAINE-EPINEPHRINE (PF) 0.5% -1:200000 IJ SOLN
INTRAMUSCULAR | Status: DC | PRN
  Administered 2014-08-18: 25 mL via PERINEURAL

## 2014-08-18 MED ORDER — OXYCODONE HCL 5 MG/5ML PO SOLN: 5.0000 mg | Freq: Once | ORAL | Status: AC | PRN

## 2014-08-18 MED ORDER — FENTANYL CITRATE (PF) 100 MCG/2ML IJ SOLN
INTRAMUSCULAR | Status: DC | PRN
  Administered 2014-08-18 (×5): 25 ug via INTRAVENOUS
  Administered 2014-08-18 (×2): 50 ug via INTRAVENOUS
  Administered 2014-08-18: 25 ug via INTRAVENOUS

## 2014-08-18 MED ORDER — LIDOCAINE HCL (CARDIAC) 20 MG/ML IV SOLN
INTRAVENOUS | Status: DC | PRN
  Administered 2014-08-18: 50 mg via INTRAVENOUS

## 2014-08-18 MED ORDER — FENTANYL CITRATE (PF) 100 MCG/2ML IJ SOLN
INTRAMUSCULAR | Status: AC
  Administered 2014-08-18: 100 ug
  Filled 2014-08-18: qty 2

## 2014-08-18 MED ORDER — OXYCODONE HCL 5 MG PO TABS
ORAL_TABLET | ORAL | Status: AC
  Filled 2014-08-18: qty 1

## 2014-08-18 MED ORDER — DEXAMETHASONE SODIUM PHOSPHATE 4 MG/ML IJ SOLN
INTRAMUSCULAR | Status: DC | PRN
  Administered 2014-08-18: 4 mg via INTRAVENOUS

## 2014-08-18 MED ORDER — LIDOCAINE HCL 4 % MT SOLN
OROMUCOSAL | Status: DC | PRN
  Administered 2014-08-18: 4 mL via TOPICAL

## 2014-08-18 MED ORDER — FENTANYL CITRATE (PF) 250 MCG/5ML IJ SOLN
INTRAMUSCULAR | Status: AC
  Filled 2014-08-18: qty 5

## 2014-08-18 MED ORDER — PROPOFOL 10 MG/ML IV BOLUS
INTRAVENOUS | Status: AC
  Filled 2014-08-18: qty 20

## 2014-08-18 MED ORDER — PHENYLEPHRINE HCL 10 MG/ML IJ SOLN
10.0000 mg | INTRAVENOUS | Status: DC | PRN
  Administered 2014-08-18: 20 ug/min via INTRAVENOUS

## 2014-08-18 MED ORDER — ONDANSETRON HCL 4 MG/2ML IJ SOLN
INTRAMUSCULAR | Status: DC | PRN
  Administered 2014-08-18: 4 mg via INTRAVENOUS

## 2014-08-18 MED ORDER — PROPOFOL 10 MG/ML IV BOLUS
INTRAVENOUS | Status: DC | PRN
  Administered 2014-08-18: 40 mg via INTRAVENOUS
  Administered 2014-08-18: 160 mg via INTRAVENOUS

## 2014-08-18 MED ORDER — MEPERIDINE HCL 25 MG/ML IJ SOLN: 6.2500 mg | INTRAMUSCULAR | Status: DC | PRN

## 2014-08-18 MED ORDER — LACTATED RINGERS IV SOLN
INTRAVENOUS | Status: DC
  Administered 2014-08-18 (×3): via INTRAVENOUS

## 2014-08-18 MED ORDER — SODIUM CHLORIDE 0.9 % IR SOLN
Status: DC | PRN
  Administered 2014-08-18: 3000 mL

## 2014-08-18 MED ORDER — DEXAMETHASONE SODIUM PHOSPHATE 4 MG/ML IJ SOLN
INTRAMUSCULAR | Status: AC
  Filled 2014-08-18: qty 1

## 2014-08-18 MED ORDER — HYDROMORPHONE HCL 1 MG/ML IJ SOLN
0.2500 mg | INTRAMUSCULAR | Status: DC | PRN
  Administered 2014-08-18 (×4): 0.5 mg via INTRAVENOUS

## 2014-08-18 SURGICAL SUPPLY — 53 items
ANCHOR JUGGERKNOT W/DRL 2/1.45 (Orthopedic Implant) ×4 IMPLANT
BLADE LONG MED 31X9 (MISCELLANEOUS) IMPLANT
BLADE SURG 11 STRL SS (BLADE) ×2 IMPLANT
BUR OVAL 4.0 (BURR) ×2 IMPLANT
CANISTER OMNI JUG 16 LITER (MISCELLANEOUS) ×2 IMPLANT
CANNULA 5.75X71 LONG (CANNULA) ×2 IMPLANT
CHLORAPREP W/TINT 26ML (MISCELLANEOUS) ×2 IMPLANT
CLSR STERI-STRIP ANTIMIC 1/2X4 (GAUZE/BANDAGES/DRESSINGS) ×2 IMPLANT
COVER SURGICAL LIGHT HANDLE (MISCELLANEOUS) ×2 IMPLANT
DRAPE INCISE IOBAN 66X45 STRL (DRAPES) ×2 IMPLANT
DRAPE STERI 35X30 U-POUCH (DRAPES) ×2 IMPLANT
DRAPE SURG 17X23 STRL (DRAPES) ×2 IMPLANT
DRAPE U-SHAPE 47X51 STRL (DRAPES) ×2 IMPLANT
DRSG AQUACEL AG ADV 3.5X10 (GAUZE/BANDAGES/DRESSINGS) ×2 IMPLANT
DRSG EMULSION OIL 3X3 NADH (GAUZE/BANDAGES/DRESSINGS) ×4 IMPLANT
DRSG PAD ABDOMINAL 8X10 ST (GAUZE/BANDAGES/DRESSINGS) ×4 IMPLANT
ELECT REM PT RETURN 9FT ADLT (ELECTROSURGICAL) ×2
ELECTRODE REM PT RTRN 9FT ADLT (ELECTROSURGICAL) ×1 IMPLANT
GAUZE SPONGE 4X4 12PLY STRL (GAUZE/BANDAGES/DRESSINGS) ×2 IMPLANT
GAUZE XEROFORM 1X8 LF (GAUZE/BANDAGES/DRESSINGS) ×2 IMPLANT
GLOVE BIO SURGEON STRL SZ7 (GLOVE) ×2 IMPLANT
GLOVE BIO SURGEON STRL SZ7.5 (GLOVE) ×2 IMPLANT
GLOVE BIOGEL PI IND STRL 7.0 (GLOVE) ×1 IMPLANT
GLOVE BIOGEL PI IND STRL 8 (GLOVE) ×1 IMPLANT
GLOVE BIOGEL PI INDICATOR 7.0 (GLOVE) ×1
GLOVE BIOGEL PI INDICATOR 8 (GLOVE) ×1
GOWN STRL REUS W/ TWL LRG LVL3 (GOWN DISPOSABLE) ×3 IMPLANT
GOWN STRL REUS W/TWL LRG LVL3 (GOWN DISPOSABLE) ×3
HANDPIECE INTERPULSE COAX TIP (DISPOSABLE) ×1
KIT BASIN OR (CUSTOM PROCEDURE TRAY) ×2 IMPLANT
KIT ROOM TURNOVER OR (KITS) ×2 IMPLANT
MANIFOLD NEPTUNE II (INSTRUMENTS) ×2 IMPLANT
NEEDLE HYPO 25GX1X1/2 BEV (NEEDLE) ×2 IMPLANT
NEEDLE SPNL 18GX3.5 QUINCKE PK (NEEDLE) ×2 IMPLANT
NS IRRIG 1000ML POUR BTL (IV SOLUTION) ×2 IMPLANT
PACK SHOULDER (CUSTOM PROCEDURE TRAY) ×2 IMPLANT
PAD ARMBOARD 7.5X6 YLW CONV (MISCELLANEOUS) ×4 IMPLANT
RESECTOR FULL RADIUS 4.2MM (BLADE) ×2 IMPLANT
SET HNDPC FAN SPRY TIP SCT (DISPOSABLE) ×1 IMPLANT
SLING ARM LRG ADULT FOAM STRAP (SOFTGOODS) ×2 IMPLANT
SLING ARM MED ADULT FOAM STRAP (SOFTGOODS) IMPLANT
SPONGE LAP 4X18 X RAY DECT (DISPOSABLE) IMPLANT
SUPPORT WRAP ARM LG (MISCELLANEOUS) IMPLANT
SUT ETHILON 3 0 PS 1 (SUTURE) ×2 IMPLANT
SUT FIBERWIRE #2 38 T-5 BLUE (SUTURE)
SUTURE FIBERWR #2 38 T-5 BLUE (SUTURE) IMPLANT
SYR CONTROL 10ML LL (SYRINGE) ×2 IMPLANT
TOWEL OR 17X24 6PK STRL BLUE (TOWEL DISPOSABLE) ×2 IMPLANT
TOWEL OR 17X26 10 PK STRL BLUE (TOWEL DISPOSABLE) ×2 IMPLANT
TUBE CONNECTING 12X1/4 (SUCTIONS) ×2 IMPLANT
TUBING ARTHROSCOPY IRRIG 16FT (MISCELLANEOUS) ×2 IMPLANT
WAND HAND CNTRL MULTIVAC 90 (MISCELLANEOUS) ×2 IMPLANT
WATER STERILE IRR 1000ML POUR (IV SOLUTION) ×2 IMPLANT

## 2014-08-18 NOTE — Anesthesia Preprocedure Evaluation (Addendum)
Anesthesia Evaluation  Patient identified by MRN, date of birth, ID band Patient awake    Reviewed: Allergy & Precautions, NPO status , Patient's Chart, lab work & pertinent test results  Airway Mallampati: I  TM Distance: >3 FB Neck ROM: Full    Dental  (+) Teeth Intact, Dental Advisory Given   Pulmonary Current Smoker,  breath sounds clear to auscultation        Cardiovascular Rhythm:Regular Rate:Normal     Neuro/Psych    GI/Hepatic   Endo/Other    Renal/GU      Musculoskeletal   Abdominal   Peds  Hematology   Anesthesia Other Findings   Reproductive/Obstetrics                            Anesthesia Physical Anesthesia Plan  ASA: II  Anesthesia Plan: General   Post-op Pain Management: MAC Combined w/ Regional for Post-op pain   Induction: Intravenous  Airway Management Planned: Oral ETT  Additional Equipment:   Intra-op Plan:   Post-operative Plan: Extubation in OR  Informed Consent: I have reviewed the patients History and Physical, chart, labs and discussed the procedure including the risks, benefits and alternatives for the proposed anesthesia with the patient or authorized representative who has indicated his/her understanding and acceptance.   Dental advisory given  Plan Discussed with: CRNA, Anesthesiologist and Surgeon  Anesthesia Plan Comments:         Anesthesia Quick Evaluation

## 2014-08-18 NOTE — Discharge Instructions (Signed)
Discharge Instructions after OpenShoulder Repair   A sling has been provided for you. Remain in your sling at all times. This includes sleeping in your sling.  Use ice on the shoulder intermittently over the first 48 hours after surgery.  Pain medicine has been prescribed for you.  Use your medicine liberally over the first 48 hours, and then you can begin to taper your use. You may take Extra Strength Tylenol or Tylenol only in place of the pain pills. DO NOT take ANY nonsteroidal anti-inflammatory pain medications: Advil, Motrin, Ibuprofen, Aleve, Naproxen, or Narprosyn.  Leave dressing on until your first visit with Dr. Ave Filterhandler. It was waterproof and you can shower with it in.    Please call [phone] during normal business hours or [phone] after hours for any problems. Including the following:  - excessive redness of the incisions - drainage for more than 4 days - fever of more than 101.5 F  *Please note that pain medications will not be refilled after hours or on weekends.

## 2014-08-18 NOTE — Anesthesia Procedure Notes (Addendum)
Anesthesia Regional Block:  Interscalene brachial plexus block  Pre-Anesthetic Checklist: ,, timeout performed, Correct Patient, Correct Site, Correct Laterality, Correct Procedure, Correct Position, site marked, Risks and benefits discussed,  Surgical consent,  Pre-op evaluation,  At surgeon's request and post-op pain management  Laterality: Right and Upper  Prep: chloraprep       Needles:  Injection technique: Single-shot  Needle Type: Echogenic Needle     Needle Length: 5cm 5 cm Needle Gauge: 21 and 21 G    Additional Needles:  Procedures: ultrasound guided (picture in chart) Interscalene brachial plexus block Narrative:  Start time: 08/18/2014 12:42 PM End time: 08/18/2014 12:47 PM Injection made incrementally with aspirations every 5 mL.  Performed by: Personally  Anesthesiologist: CREWS, DAVID   Procedure Name: Intubation Date/Time: 08/18/2014 2:09 PM Performed by: Suzy Bouchard Pre-anesthesia Checklist: Patient identified, Emergency Drugs available, Suction available, Patient being monitored and Timeout performed Patient Re-evaluated:Patient Re-evaluated prior to inductionOxygen Delivery Method: Circle system utilized Preoxygenation: Pre-oxygenation with 100% oxygen Intubation Type: IV induction Ventilation: Mask ventilation without difficulty Laryngoscope Size: Miller and 2 Grade View: Grade I Tube type: Oral Tube size: 7.0 mm Number of attempts: 1 Airway Equipment and Method: Stylet and LTA kit utilized Placement Confirmation: ETT inserted through vocal cords under direct vision and breath sounds checked- equal and bilateral Secured at: 22 cm Tube secured with: Tape Dental Injury: Teeth and Oropharynx as per pre-operative assessment

## 2014-08-18 NOTE — H&P (Signed)
Dawn Chen is an 54 y.o. female.   Chief Complaint: Failed subscapularis HPI: s/p recent R TSA with failed subscap repair due to noncompliance  Past Medical History  Diagnosis Date  . Arthritis   . Anemia   . Carpal tunnel syndrome   . Depression   . Anxiety   . Chronic pain   . Sleep apnea     Resolved d/t weight loss  . Osteoarthritis   . 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 Carpal tunnel release Bilateral 2001  . Hysterotomy  2007  . Shoulder acromioplasty Right 2011  . Joint replacement Right 03/2013    total knee  . Gynecologic cryosurgery    . Abdominal hysterectomy    . Lumbar fusion  2014  . Cholecystectomy    . Gastric bypass  2013  . Total shoulder arthroplasty Right 07/30/2014    Procedure: TOTAL SHOULDER ARTHROPLASTY;  Surgeon: Tania Ade, MD;  Location: [facility];  Service: Orthopedics;  Laterality: Right;  Right shoulder arthroplasty    Family History  Problem Relation Age of Onset  . Cancer Maternal Aunt   . Cancer Maternal Grandmother    Social History:  reports that she has been smoking Cigarettes.  She has been smoking about 0.50 packs per day. She has never used smokeless tobacco. She reports that she does not drink alcohol or use illicit drugs.  Allergies:  Allergies  Allergen Reactions  . Diflucan [Fluconazole] Other (See Comments)    Headache   . Gabapentin     Pass out     Medications Prior to Admission  Medication Sig Dispense Refill  . buPROPion (WELLBUTRIN XL) 150 MG 24 hr tablet Take 1 tablet (150 mg total) by mouth daily. 15 tablet 0  . Cholecalciferol (D3-1000) 1000 UNITS capsule Take 1,000 Units by mouth daily.    . Cyanocobalamin (B-12) 1500 MCG TBCR Take 1,500 mcg by mouth daily. In addition to the 1028mg    . Multiple Vitamins-Minerals (MULTIVITAMIN WITH MINERALS) tablet Take 1 tablet by mouth daily.    .Marland KitchenoxyCODONE  (ROXICODONE) 15 MG immediate release tablet Take 15 mg by mouth every 4 (four) hours as needed for pain. Take every 4- 6 hours as needed. Dose and instructions verified with [facility]. RBT    . QUEtiapine (SEROQUEL XR) 300 MG 24 hr tablet Take 2 tablets (600 mg total) by mouth at bedtime. 30 tablet 0  . sertraline (ZOLOFT) 25 MG tablet Take 1 tablet (25 mg total) by mouth daily. 15 tablet 0  . docusate sodium (COLACE) 100 MG capsule Take 1 capsule (100 mg total) by mouth 3 (three) times daily as needed. 20 capsule 0  . escitalopram (LEXAPRO) 20 MG tablet Take 20 mg by mouth daily.    .Marland KitchenoxyCODONE (ROXICODONE) 5 MG immediate release tablet 1-3 q 3-4 hrs prn pain 60 tablet 0    Results for orders placed or performed during the hospital encounter of 08/18/14 (from the past 48 hour(s))  CBC     Status: Abnormal   Collection Time: 08/18/14 11:16 AM  Result Value Ref Range   WBC 7.8 4.0 - 10.5 K/uL   RBC 3.52 (L) 3.87 - 5.11 MIL/uL   Hemoglobin 10.1 (L) 12.0 - 15.0 g/dL   HCT 30.6 (L) 36.0 - 46.0 %   MCV 86.9 78.0 - 100.0 fL   MCH 28.7 26.0 - 34.0 pg  MCHC 33.0 30.0 - 36.0 g/dL   RDW 16.7 (H) 11.5 - 15.5 %   Platelets 529 (H) 150 - 400 K/uL  Basic metabolic panel     Status: Abnormal   Collection Time: 08/18/14 11:16 AM  Result Value Ref Range   Sodium 141 135 - 145 mmol/L   Potassium 3.4 (L) 3.5 - 5.1 mmol/L   Chloride 102 101 - 111 mmol/L   CO2 27 22 - 32 mmol/L   Glucose, Bld 107 (H) 70 - 99 mg/dL   BUN 8 6 - 20 mg/dL   Creatinine, Ser 0.73 0.44 - 1.00 mg/dL   Calcium 9.2 8.9 - 10.3 mg/dL   GFR calc non Af Amer >60 >60 mL/min   GFR calc Af Amer >60 >60 mL/min    Comment: (NOTE) The eGFR has been calculated using the CKD EPI equation. This calculation has not been validated in all clinical situations. eGFR's persistently <60 mL/min signify possible Chronic Kidney Disease.    Anion gap 12 5 - 15  hCG, serum, qualitative     Status: None   Collection Time: 08/18/14 11:16 AM  Result  Value Ref Range   Preg, Serum NEGATIVE NEGATIVE    Comment:        THE SENSITIVITY OF THIS METHODOLOGY IS >10 mIU/mL.    No results found.  Review of Systems  All other systems reviewed and are negative.   Blood pressure 140/98, pulse 86, temperature 98.9 F (37.2 C), temperature source Oral, resp. rate 18, height 5' 7.5" (1.715 m), weight 85.276 kg (188 lb), SpO2 100 %. Physical Exam  Constitutional: She is oriented to person, place, and time. She appears well-developed and well-nourished.  HENT:  Head: Atraumatic.  Eyes: EOM are normal.  Cardiovascular: Intact distal pulses.   Respiratory: Effort normal.  Musculoskeletal:  R shoulder incision healed, mod swelling, NVID  Neurological: She is alert and oriented to person, place, and time.  Skin: Skin is warm and dry.  Psychiatric: She has a normal mood and affect.     Assessment/Plan S/p R TSA, failed subscap repair Plan revision subscap repair Risks / benefits of surgery discussed Consent on chart  NPO for OR Preop antibiotics held   [facility] 08/18/2014, 1:48 PM

## 2014-08-18 NOTE — Transfer of Care (Signed)
Immediate Anesthesia Transfer of Care Note  Patient: Dawn SneddonLori Ann Chen  Procedure(s) Performed: Procedure(s) with comments: RIGHT SHOULDER  SUBSCAPULARIS REPAIR (Right) - Right shoulder subscapularis repair  Patient Location: PACU  Anesthesia Type:GA combined with regional for post-op pain  Level of Consciousness: awake and alert   Airway & Oxygen Therapy: Patient Spontanous Breathing and Patient connected to nasal cannula oxygen  Post-op Assessment: Report given to RN, Post -op Vital signs reviewed and stable and Patient moving all extremities  Post vital signs: Reviewed and stable  Last Vitals:  Filed Vitals:   08/18/14 1618  BP: 135/80  Pulse: 78  Temp: 36.7 C  Resp: 15    Complications: No apparent anesthesia complications

## 2014-08-18 NOTE — Op Note (Signed)
Procedure(s): SHOULDER SUBSCAPULARIS REPAIR Procedure Note  Dawn SneddonLori Ann Chen female 54 y.o. 08/18/2014  Procedure(s) and Anesthesia Type: Right shoulder subscapularis repair after total shoulder replacement     Surgeon(s) and Role:    * Jones BroomJustin Clovis Mankins, MD - Primary   Indications:  54 y.o. female  status post recent total shoulder replacement he was noncompliant with postoperative restrictions and resulted in early subscapularis failure. This was clinically and radiographically evident first postoperative visit with acute swelling. She was indicated for return the operating room to attempt to repair the subscapularis. She understood risks benefits and alternatives to the procedure and wished to go forward with surgery.     Surgeon: Mable ParisHANDLER,Jevaun Strick WILLIAM   Assistants: Damita Lackanielle Lalibert PA-C [facility](Danielle was present and scrubbed throughout the procedure and was essential in positioning, retraction, exposure, and closure)  Anesthesia: General endotracheal anesthesia with preoperative interscalene block given by the attending anesthesiologist     Procedure Detail  SHOULDER SUBSCAPULARIS REPAIR  Findings: The fixation of the lesser tuberosity osteotomy was lost but the tendon and bony fragment had not retracted significantly medially. The old sutures were cut and discarded and the repair was carried out with #2 juggernaut anchors in a double row repair. There was no sign of infection.  Estimated Blood Loss:  less than 50 mL         Drains: None   Blood Given: none          Specimens: none        Complications:  * No complications entered in OR log *         Disposition: PACU - hemodynamically stable.         Condition: stable    Procedure:   The patient was identified in the preoperative holding area where I personally marked the operative extremity after verifying with the patient and consent. She  was taken to the operating room where She was transferred to the    operative table.  The patient received an interscalene block in   the holding area by the attending anesthesiologist.  General anesthesia was induced   in the operating room without complication.  The patient did receive IV  Ancef prior to the commencement of the procedure.  The patient was   placed in the beach-chair position with the back raised about 30   degrees.  The nonoperative extremity and head and neck were carefully   positioned and padded protecting against neurovascular compromise.  The   left upper extremity was then prepped and draped in the standard sterile   fashion.    The appropriate operative time-out was performed with   Anesthesia, the perioperative staff, as well as myself and we all agreed   that the right side was the correct operative site.  An approximately   8 cm incision was made through the previous incision. A large amount of clear yellow fluid came forth sent for stat Gram stain and culture. There is no evidence of purulence. All tissues appeared healthy. The deltopectoral interval was opened. The deep retractor was placed beneath the conjoined tendon medially and the deltoid laterally. Subscapularis tendon and lesser tuberosity osteotomy were identified. The fixation of the osteotomy had failed but pulling through the bone and tendon. All of the fiber tapes were intact. These were cut and discarded. The lesser tuberosity osteotomy and subscapular tendon were mobilized and were easily able to be brought back to the bony bed on the humerus. Therefore the joint was copiously irrigated with  pulse lavage normal saline and the repair was then carried out by placing 2 #2 juggernaut anchors medial to the osteotomy site and passing the 4 strands up through the bone tendon junction medial to the osteotomy. These were tied down the medial row and then brought over the osteotomy through the lateral cut edge of the osteotomy site on the humerus and out lateral to the bicipital groove.  There were tied over bony bridge which nicely compressed the osteotomy in its anatomic position. The repair was stressed with external rotation felt to be strong. Again copious irrigation was used and then the wound was closed in layers with 0 Vicryl in deltopectoral interval, 2-0 Vicryl in a deep dermal layer and 4-0 Monocryl for skin closure. Steri-Strips were applied. A light sterile dressing was applied. The patient was placed in a sling immobilizer allowed to awaken from anesthesia, transferred to the stretcher and taken to the recovery room in stable condition.      POSTOPERATIVE PLAN:  She will be observed in the recovery room and assuming she is stable with good pain control will be discharged home with her husband. She will follow-up in 2 weeks for wound check. The meantime she will strictly stay in her sling. She will not begin any exercises at this point.

## 2014-08-19 ENCOUNTER — Encounter (HOSPITAL_COMMUNITY): Payer: Self-pay | Admitting: Orthopedic Surgery

## 2014-08-19 NOTE — Anesthesia Postprocedure Evaluation (Signed)
  Anesthesia Post-op Note  Patient: Dawn SneddonLori Ann Nibert  Procedure(s) Performed: Procedure(s) with comments: RIGHT SHOULDER  SUBSCAPULARIS REPAIR (Right) - Right shoulder subscapularis repair  Patient Location: PACU  Anesthesia Type:General  Level of Consciousness: awake, alert , oriented and patient cooperative  Airway and Oxygen Therapy: Patient Spontanous Breathing  Post-op Pain: mild  Post-op Assessment: Post-op Vital signs reviewed, Patient's Cardiovascular Status Stable, Respiratory Function Stable, Patent Airway, No signs of Nausea or vomiting and Pain level controlled  Post-op Vital Signs: stable  Last Vitals:  Filed Vitals:   08/18/14 1718  BP: 157/81  Pulse: 84  Temp:   Resp: 18    Complications: No apparent anesthesia complications

## 2014-08-22 LAB — BODY FLUID CULTURE: Culture: NO GROWTH

## 2014-09-02 LAB — ANAEROBIC CULTURE

## 2014-10-04 ENCOUNTER — Emergency Department (HOSPITAL_COMMUNITY)
Admission: EM | Admit: 2014-10-04 | Discharge: 2014-10-04 | Disposition: A | Discharge status: Home / Self Care | Payer: Medicare Other | Attending: Emergency Medicine | Admitting: Emergency Medicine

## 2014-10-04 ENCOUNTER — Encounter (HOSPITAL_COMMUNITY): Payer: Self-pay | Admitting: Emergency Medicine

## 2014-10-04 DIAGNOSIS — Z87828 Personal history of other (healed) physical injury and trauma: Secondary | ICD-10-CM | POA: Diagnosis not present

## 2014-10-04 DIAGNOSIS — D649 Anemia, unspecified: Secondary | ICD-10-CM | POA: Insufficient documentation

## 2014-10-04 DIAGNOSIS — M25511 Pain in right shoulder: Secondary | ICD-10-CM | POA: Diagnosis not present

## 2014-10-04 DIAGNOSIS — M1990 Unspecified osteoarthritis, unspecified site: Secondary | ICD-10-CM | POA: Insufficient documentation

## 2014-10-04 DIAGNOSIS — R42 Dizziness and giddiness: Secondary | ICD-10-CM | POA: Diagnosis not present

## 2014-10-04 DIAGNOSIS — G8929 Other chronic pain: Secondary | ICD-10-CM | POA: Diagnosis not present

## 2014-10-04 DIAGNOSIS — Z720 Tobacco use: Secondary | ICD-10-CM | POA: Insufficient documentation

## 2014-10-04 DIAGNOSIS — R112 Nausea with vomiting, unspecified: Secondary | ICD-10-CM | POA: Insufficient documentation

## 2014-10-04 DIAGNOSIS — Z3202 Encounter for pregnancy test, result negative: Secondary | ICD-10-CM | POA: Insufficient documentation

## 2014-10-04 DIAGNOSIS — Z79899 Other long term (current) drug therapy: Secondary | ICD-10-CM | POA: Insufficient documentation

## 2014-10-04 LAB — CBC WITH DIFFERENTIAL/PLATELET
Basophils Absolute: 0.1 10*3/uL (ref 0.0–0.1)
Basophils Relative: 1 % (ref 0–1)
Eosinophils Absolute: 0.2 10*3/uL (ref 0.0–0.7)
Eosinophils Relative: 2 % (ref 0–5)
HCT: 29.2 % — ABNORMAL LOW (ref 36.0–46.0)
Hemoglobin: 9.2 g/dL — ABNORMAL LOW (ref 12.0–15.0)
Lymphocytes Relative: 39 % (ref 12–46)
Lymphs Abs: 2.8 10*3/uL (ref 0.7–4.0)
MCH: 26.1 pg (ref 26.0–34.0)
MCHC: 31.5 g/dL (ref 30.0–36.0)
MCV: 83 fL (ref 78.0–100.0)
Monocytes Absolute: 0.4 10*3/uL (ref 0.1–1.0)
Monocytes Relative: 6 % (ref 3–12)
Neutro Abs: 3.6 10*3/uL (ref 1.7–7.7)
Neutrophils Relative %: 52 % (ref 43–77)
Platelets: 350 10*3/uL (ref 150–400)
RBC: 3.52 MIL/uL — ABNORMAL LOW (ref 3.87–5.11)
RDW: 16.2 % — ABNORMAL HIGH (ref 11.5–15.5)
WBC: 7.1 10*3/uL (ref 4.0–10.5)

## 2014-10-04 LAB — COMPREHENSIVE METABOLIC PANEL
ALT: 31 U/L (ref 14–54)
AST: 26 U/L (ref 15–41)
Albumin: 3.3 g/dL — ABNORMAL LOW (ref 3.5–5.0)
Alkaline Phosphatase: 94 U/L (ref 38–126)
Anion gap: 7 (ref 5–15)
BUN: 16 mg/dL (ref 6–20)
CO2: 27 mmol/L (ref 22–32)
Calcium: 9.2 mg/dL (ref 8.9–10.3)
Chloride: 108 mmol/L (ref 101–111)
Creatinine, Ser: 0.66 mg/dL (ref 0.44–1.00)
GFR calc Af Amer: >60 mL/min (ref >60)
GFR calc non Af Amer: >60 mL/min (ref >60)
Glucose, Bld: 91 mg/dL (ref 65–99)
Potassium: 3.8 mmol/L (ref 3.5–5.1)
Sodium: 142 mmol/L (ref 135–145)
Total Bilirubin: 0.3 mg/dL (ref 0.3–1.2)
Total Protein: 6.8 g/dL (ref 6.5–8.1)

## 2014-10-04 LAB — URINALYSIS, ROUTINE W REFLEX MICROSCOPIC
Bilirubin Urine: NEGATIVE
Glucose, UA: NEGATIVE mg/dL
Hgb urine dipstick: NEGATIVE
Ketones, ur: NEGATIVE mg/dL
Leukocytes, UA: NEGATIVE
Nitrite: NEGATIVE
Protein, ur: NEGATIVE mg/dL
Specific Gravity, Urine: 1.024 (ref 1.005–1.030)
Urobilinogen, UA: 0.2 mg/dL (ref 0.0–1.0)
pH: 6.5 (ref 5.0–8.0)

## 2014-10-04 LAB — LIPASE, BLOOD: Lipase: 28 U/L (ref 22–51)

## 2014-10-04 MED ORDER — ONDANSETRON 4 MG PO TBDP: 4.0000 mg | ORAL_TABLET | Freq: Three times a day (TID) | ORAL | Status: DC | PRN

## 2014-10-04 NOTE — ED Notes (Signed)
Pt states dizziness and headache ongoing for two-three days. States she dropped her phone in the toilet in the phone d/t dizziness.

## 2014-10-04 NOTE — ED Provider Notes (Signed)
CSN: [id number]     Arrival date & time 10/04/14  0418 History   First MD Initiated Contact with Patient 10/04/14 0444     Chief Complaint  Patient presents with  . Dizziness  . Headache     (Consider location/radiation/quality/duration/timing/severity/associated sxs/prior Treatment) HPI  Karma Ansley is a 54 y.o. female with past medical history of right shoulder injury status post repair presenting today with multiple complaints. Patient states for the past month since surgery she has been dizzy, having pain in her right shoulder, having nausea and vomiting, and just not feeling herself. Nothing makes the symptoms better or worse. She saw her primary care physician who advised her to stop all medications because she had elevated LFTs. This was done but her symptoms did not completely improve.  Patient now presents for the symptoms persisting. She has no further complaints.  10 Systems reviewed and are negative for acute change except as noted in the HPI.   Past Medical History  Diagnosis Date  . Arthritis   . Anemia   . Carpal tunnel syndrome   . Depression   . Anxiety   . Chronic pain   . Sleep apnea     Resolved d/t weight loss  . Osteoarthritis   . 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 Carpal tunnel release Bilateral 2001  . Hysterotomy  2007  . Shoulder acromioplasty Right 2011  . Joint replacement Right 03/2013    total knee  . Gynecologic cryosurgery    . Abdominal hysterectomy    . Lumbar fusion  2014  . Cholecystectomy    . Gastric bypass  2013  . Total shoulder arthroplasty Right 07/30/2014    Procedure: TOTAL SHOULDER ARTHROPLASTY;  Surgeon: Jones Broom, MD;  Location: [facility] OR;  Service: Orthopedics;  Laterality: Right;  Right shoulder arthroplasty  . Shoulder arthroscopy with rotator cuff repair Right 08/18/2014    Procedure: RIGHT SHOULDER   SUBSCAPULARIS REPAIR;  Surgeon: Jones Broom, MD;  Location: [facility] OR;  Service: Orthopedics;  Laterality: Right;  Right shoulder subscapularis repair   Family History  Problem Relation Age of Onset  . Cancer Maternal Aunt   . Cancer Maternal Grandmother    History  Substance Use Topics  . Smoking status: Current Every Day Smoker -- 0.50 packs/day    Types: Cigarettes  . Smokeless tobacco: Never Used     Comment: Smoking varies  . Alcohol Use: No   OB History    No data available     Review of Systems    Allergies  Diflucan and Gabapentin  Home Medications   Prior to Admission medications   Medication Sig Start Date End Date Taking? Authorizing Provider  buPROPion (WELLBUTRIN XL) 150 MG 24 hr tablet Take 1 tablet (150 mg total) by mouth daily. 02/18/14   Elwin Mocha, MD  Cholecalciferol (D3-1000) 1000 UNITS capsule Take 1,000 Units by mouth daily.    Historical Provider, MD  Cyanocobalamin (B-12) 1500 MCG TBCR Take 1,500 mcg by mouth daily. In addition to the    Historical Provider, MD  docusate sodium (COLACE) 100 MG capsule Take 1 capsule (100 mg total) by mouth 3 (three) times daily as needed. 07/31/14   Jiles Harold, PA-C  escitalopram (LEXAPRO) 20 MG tablet Take 20 mg by mouth daily.    Adaku Nnodi, MD  Multiple Vitamins-Minerals (MULTIVITAMIN WITH MINERALS) tablet Take 1 tablet  by mouth daily.    Historical Provider, MD  oxyCODONE (ROXICODONE) 15 MG immediate release tablet Take 15 mg by mouth every 4 (four) hours as needed for pain. Take every 4- 6 hours as needed. Dose and instructions verified with [facility]. RBT    Historical Provider, MD  oxyCODONE (ROXICODONE) 5 MG immediate release tablet 1-3 q 3-4 hrs prn pain 07/31/14   Jiles Harold, PA-C  QUEtiapine (SEROQUEL XR) 300 MG 24 hr tablet Take 2 tablets (600 mg total) by mouth at bedtime. 02/18/14   Elwin Mocha, MD  sertraline (ZOLOFT) 25 MG tablet Take 1 tablet (25 mg total) by mouth daily. 02/18/14    Elwin Mocha, MD   BP 132/90 mmHg  Pulse 72  Temp(Src) 98.3 F (36.8 C) (Oral)  Resp 16  SpO2 100% Physical Exam  Constitutional: She is oriented to person, place, and time. She appears well-developed and well-nourished. No distress.  HENT:  Head: Normocephalic and atraumatic.  Nose: Nose normal.  Mouth/Throat: Oropharynx is clear and moist. No oropharyngeal exudate.  Eyes: Conjunctivae and EOM are normal. Pupils are equal, round, and reactive to light. No scleral icterus.  Neck: Normal range of motion. Neck supple. No JVD present. No tracheal deviation present. No thyromegaly present.  Cardiovascular: Normal rate, regular rhythm and normal heart sounds.  Exam reveals no gallop and no friction rub.   No murmur heard. Pulmonary/Chest: Effort normal and breath sounds normal. No respiratory distress. She has no wheezes. She exhibits no tenderness.  Abdominal: Soft. Bowel sounds are normal. She exhibits no distension and no mass. There is no tenderness. There is no rebound and no guarding.  Musculoskeletal: Normal range of motion. She exhibits no edema or tenderness.  Lymphadenopathy:    She has no cervical adenopathy.  Neurological: She is alert and oriented to person, place, and time. No cranial nerve deficit. She exhibits normal muscle tone.  Skin: Skin is warm and dry. No rash noted. No erythema. No pallor.  Nursing note and vitals reviewed.   ED Course  Procedures (including critical care time) Labs Review Labs Reviewed  CBC WITH DIFFERENTIAL/PLATELET - Abnormal; Notable for the following:    RBC 3.52 (*)    Hemoglobin 9.2 (*)    HCT 29.2 (*)    RDW 16.2 (*)    All other components within normal limits  COMPREHENSIVE METABOLIC PANEL - Abnormal; Notable for the following:    Albumin 3.3 (*)    All other components within normal limits  POC URINE PREG, ED - Abnormal; Notable for the following:    Preg Test, Ur POSITIVE (*)    All other components within normal limits   LIPASE, BLOOD  URINALYSIS, ROUTINE W REFLEX MICROSCOPIC (NOT AT [facility])    Imaging Review No results found.   EKG Interpretation   Date/Time:  Sunday October 04 2014 04:42:45 EDT Ventricular Rate:  73 PR Interval:  127 QRS Duration: 84 QT Interval:  410 QTC Calculation: 452 R Axis:   45 Text Interpretation:  Sinus rhythm Confirmed by Erroll Luna  [phone]) on 10/04/2014 5:26:30 AM      MDM   Final diagnoses:  None   patient presents emergency department for multiple complaints, all occurring for the past month. She overall appears well and in no acute distress. Her physical exam is completely normal. Will obtain basic laboratory studies to evaluate for liver function and dehydration. Patient was advised to see her primary care physician within 3 days for close follow-up. There is no  emergent condition I can find tonight.   Pregnancy test is listed as positive, however ED tech has informed me that this was an error.  She hit the wrong button and meant to place it in as negative.  Please disregard the lab finding.  In addition, the patient has had a total hysterectomy and denies sexual intercourse since last October.  Her vital signs remain within her normal limits and she is safe for discharge.  Tomasita Crumble, MD 10/04/14 1540

## 2014-10-04 NOTE — Discharge Instructions (Signed)
Dizziness Dawn Chen, your blood work today was normal. See a primary care physician within 3 days for close follow-up. Take Zofran as needed for nausea. If symptoms worsen come back to the emergency department immediately. Thank you.  Dizziness means you feel unsteady or lightheaded. You might feel like you are going to pass out (faint). HOME CARE   Drink enough fluids to keep your pee (urine) clear or pale yellow.  Take your medicines exactly as told by your doctor. If you take blood pressure medicine, always stand up slowly from the lying or sitting position. Hold on to something to steady yourself.  If you need to stand in one place for a long time, move your legs often. Tighten and relax your leg muscles.  Have someone stay with you until you feel okay.  Do not drive or use heavy machinery if you feel dizzy.  Do not drink alcohol. GET HELP RIGHT AWAY IF:   You feel dizzy or lightheaded and it gets worse.  You feel sick to your stomach (nauseous), or you throw up (vomit).  You have trouble talking or walking.  You feel weak or have trouble using your arms, hands, or legs.  You cannot think clearly or have trouble forming sentences.  You have chest pain, belly (abdominal) pain, sweating, or you are short of breath.  Your vision changes.  You are bleeding.  You have problems from your medicine that seem to be getting worse. MAKE SURE YOU:   Understand these instructions.  Will watch your condition.  Will get help right away if you are not doing well or get worse. Document Released: 03/16/2011 Document Revised: 06/19/2011 Document Reviewed: 03/16/2011 [facility] Patient Information 2015 [place], [place]. This information is not intended to replace advice given to you by your health care provider. Make sure you discuss any questions you have with your health care provider. Nausea and Vomiting Nausea means you feel sick to your stomach. Throwing up (vomiting) is a reflex where  stomach contents come out of your mouth. HOME CARE   Take medicine as told by your doctor.  Do not force yourself to eat. However, you do need to drink fluids.  If you feel like eating, eat a normal diet as told by your doctor.  Eat rice, wheat, potatoes, bread, lean meats, yogurt, fruits, and vegetables.  Avoid high-fat foods.  Drink enough fluids to keep your pee (urine) clear or pale yellow.  Ask your doctor how to replace body fluid losses (rehydrate). Signs of body fluid loss (dehydration) include:  Feeling very thirsty.  Dry lips and mouth.  Feeling dizzy.  Dark pee.  Peeing less than normal.  Feeling confused.  Fast breathing or heart rate. GET HELP RIGHT AWAY IF:   You have blood in your throw up.  You have black or bloody poop (stool).  You have a bad headache or stiff neck.  You feel confused.  You have bad belly (abdominal) pain.  You have chest pain or trouble breathing.  You do not pee at least once every 8 hours.  You have cold, clammy skin.  You keep throwing up after 24 to 48 hours.  You have a fever. MAKE SURE YOU:   Understand these instructions.  Will watch your condition.  Will get help right away if you are not doing well or get worse. Document Released: 09/13/2007 Document Revised: 06/19/2011 Document Reviewed: 08/26/2010 [facility] Patient Information 2015 [place], [place]. This information is not intended to replace advice given to  you by your health care provider. Make sure you discuss any questions you have with your health care provider.

## 2014-10-05 LAB — POC URINE PREG, ED
Preg Test, Ur: NEGATIVE
Preg Test, Ur: NEGATIVE

## 2015-07-02 ENCOUNTER — Other Ambulatory Visit: Payer: Self-pay | Admitting: Family Medicine

## 2015-07-02 DIAGNOSIS — Z1231 Encounter for screening mammogram for malignant neoplasm of breast: Secondary | ICD-10-CM

## 2015-08-12 ENCOUNTER — Ambulatory Visit: Payer: Medicare Other

## 2016-05-18 ENCOUNTER — Other Ambulatory Visit: Payer: Self-pay | Admitting: Orthopedic Surgery

## 2016-05-18 DIAGNOSIS — M79652 Pain in left thigh: Secondary | ICD-10-CM

## 2016-05-22 ENCOUNTER — Encounter (HOSPITAL_COMMUNITY): Payer: Self-pay

## 2016-05-22 ENCOUNTER — Ambulatory Visit (HOSPITAL_COMMUNITY)
Admission: RE | Admit: 2016-05-22 | Discharge: 2016-05-22 | Disposition: A | Discharge status: Home / Self Care | Payer: Medicare Other | Source: Ambulatory Visit | Attending: Orthopedic Surgery | Admitting: Orthopedic Surgery

## 2016-05-22 DIAGNOSIS — M79652 Pain in left thigh: Secondary | ICD-10-CM

## 2016-07-15 ENCOUNTER — Emergency Department (HOSPITAL_COMMUNITY)
Admission: EM | Admit: 2016-07-15 | Discharge: 2016-07-15 | Disposition: A | Discharge status: Home / Self Care | Payer: Medicare Other | Attending: Emergency Medicine | Admitting: Emergency Medicine

## 2016-07-15 DIAGNOSIS — F17210 Nicotine dependence, cigarettes, uncomplicated: Secondary | ICD-10-CM | POA: Insufficient documentation

## 2016-07-15 DIAGNOSIS — R21 Rash and other nonspecific skin eruption: Secondary | ICD-10-CM | POA: Diagnosis present

## 2016-07-15 DIAGNOSIS — Z96651 Presence of right artificial knee joint: Secondary | ICD-10-CM | POA: Insufficient documentation

## 2016-07-15 DIAGNOSIS — Z79899 Other long term (current) drug therapy: Secondary | ICD-10-CM | POA: Insufficient documentation

## 2016-07-15 DIAGNOSIS — Z96611 Presence of right artificial shoulder joint: Secondary | ICD-10-CM | POA: Diagnosis not present

## 2016-07-15 DIAGNOSIS — L309 Dermatitis, unspecified: Secondary | ICD-10-CM

## 2016-07-15 NOTE — ED Provider Notes (Signed)
[facility]-EMERGENCY DEPT Provider Note   CSN: [id number] Arrival date & time: 07/15/16  1558    By signing my name below, I, Dawn Chen, attest that this documentation has been prepared under the direction and in the presence of Dawn Mechanic, PA-C Electronically Signed: Valentino Chen, ED Scribe. 07/15/16. 4:58 PM.  History   Chief Complaint Chief Complaint  Patient presents with  . Rash   The history is provided by the patient. No language interpreter was used.   HPI Comments: Dawn Chen is a 56 y.o. female who presents to the Emergency Department complaining of 6/10, gradual onset, constant, rash to the right corner of mouth that occurred three days ago. Pt notes she had a "slit" on the right corner of her mouth and reports placing some neosporin ointment on the slit and adjacent skin with no relief. Shortly after patient noted redness and irration to the site of ointment application.  She also notes putting hydrogen peroxide on the affected area earlier today that caused dryness and more irritation. . She reports associated tongue pain. Pt denies using neosporin on her face before. No alleviating factors noted. Pt denies h/o of cold sores, hand or foot involvement, vaginal involvement or any other skin irritation. She also denies fever, swelling, difficulty swallowing. Pt notes the only new medication she is taking is tumeric. This was the first time patient has used neosporin on face.   Past Medical History:  Diagnosis Date  . Anemia   . Anxiety   . Arthritis   . Carpal tunnel syndrome   . Chronic pain   . Depression   . Osteoarthritis   . Sleep apnea    Resolved d/t weight loss  . 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 Glenohumeral arthritis 07/30/2014  . Chronic lumbar pain 07/09/2012  . Lumbar degenerative disc disease 07/09/2012    Past Surgical History:   Procedure Laterality Date  . ABDOMINAL HYSTERECTOMY    . CARPAL TUNNEL RELEASE Bilateral 2001  . CHOLECYSTECTOMY    . GASTRIC BYPASS  2013  . GYNECOLOGIC CRYOSURGERY    . HYSTEROTOMY  2007  . JOINT REPLACEMENT Right 03/2013   total knee  . LUMBAR FUSION  2014  . SHOULDER ACROMIOPLASTY Right 2011  . SHOULDER ARTHROSCOPY WITH ROTATOR CUFF REPAIR Right 08/18/2014   Procedure: RIGHT SHOULDER  SUBSCAPULARIS REPAIR;  Surgeon: Jones Broom, MD;  Location: [facility] OR;  Service: Orthopedics;  Laterality: Right;  Right shoulder subscapularis repair  . TOTAL SHOULDER ARTHROPLASTY Right 07/30/2014   Procedure: TOTAL SHOULDER ARTHROPLASTY;  Surgeon: Jones Broom, MD;  Location: [facility] OR;  Service: Orthopedics;  Laterality: Right;  Right shoulder arthroplasty    OB History    No data available       Home Medications    Prior to Admission medications   Medication Sig Start Date End Date Taking? Authorizing Provider  buPROPion (WELLBUTRIN XL) 150 MG 24 hr tablet Take 1 tablet (150 mg total) by mouth daily. 02/18/14   Elwin Mocha, MD  Cholecalciferol (D3-1000) 1000 UNITS capsule Take 1,000 Units by mouth daily.    Historical Provider, MD  Cyanocobalamin (B-12) 1500 MCG TBCR Take 1,500 mcg by mouth daily. In addition to the    Historical Provider, MD  docusate sodium (COLACE) 100 MG capsule Take 1 capsule (100 mg total) by mouth 3 (three) times daily as needed. 07/31/14  Jiles Harold, PA-C  escitalopram (LEXAPRO) 20 MG tablet Take 20 mg by mouth daily.    Adaku Nnodi, MD  Multiple Vitamins-Minerals (MULTIVITAMIN WITH MINERALS) tablet Take 1 tablet by mouth daily.    Historical Provider, MD  ondansetron (ZOFRAN ODT) 4 MG disintegrating tablet Take 1 tablet (4 mg total) by mouth every 8 (eight) hours as needed for nausea or vomiting. 10/04/14   Tomasita Crumble, MD  oxyCODONE (ROXICODONE) 15 MG immediate release tablet Take 15 mg by mouth every 4 (four) hours as needed for pain. Take every 4- 6  hours as needed. Dose and instructions verified with [facility]. RBT    Historical Provider, MD  oxyCODONE (ROXICODONE) 5 MG immediate release tablet 1-3 q 3-4 hrs prn pain 07/31/14   Jiles Harold, PA-C  QUEtiapine (SEROQUEL XR) 300 MG 24 hr tablet Take 2 tablets (600 mg total) by mouth at bedtime. 02/18/14   Elwin Mocha, MD  sertraline (ZOLOFT) 25 MG tablet Take 1 tablet (25 mg total) by mouth daily. 02/18/14   Elwin Mocha, MD    Family History Family History  Problem Relation Age of Onset  . Cancer Maternal Aunt   . Cancer Maternal Grandmother     Social History Social History  Substance Use Topics  . Smoking status: Current Every Day Smoker    Packs/day: 0.50    Types: Cigarettes  . Smokeless tobacco: Never Used     Comment: Smoking varies  . Alcohol use No     Allergies   Diflucan [fluconazole] and Gabapentin   Review of Systems Review of Systems  All other systems reviewed and are negative.   A complete 10 system review of systems was obtained and all systems are negative except as noted in the HPI and PMH.   Physical Exam Updated Vital Signs BP (!) 142/94 (BP Location: Left Arm)   Pulse 100   Temp 97.7 F (36.5 C) (Oral)   Resp 18   Wt 83.5 kg   SpO2 100%   BMI 28.39 kg/m   Physical Exam  Constitutional: She appears well-developed and well-nourished.  HENT:  Head: Normocephalic and atraumatic.  Irritation noted to the skin lateral to the mouth.  No vesicles, drainage, or signs of cellulitis noted.  Tongue is normal with no signs of trauma or vesicles, floor mouth is soft.  No abnormalities noted to the tongue cheeks, oropharynx.  Eyes: Conjunctivae are normal. Right eye exhibits no discharge. Left eye exhibits no discharge.  Pulmonary/Chest: Effort normal. No respiratory distress.  Neurological: She is alert. Coordination normal.  Skin: Skin is warm and dry. No rash noted. She is not diaphoretic. No erythema.  No rashes other than those noted above.  No  palmar involvement  Psychiatric: She has a normal mood and affect.  Nursing note and vitals reviewed.   ED Treatments / Results   DIAGNOSTIC STUDIES: Oxygen Saturation is 100% on RA, normal by my interpretation.    COORDINATION OF CARE: 4:58 PM Discussed treatment plan with pt at bedside and pt agreed to plan.   Labs (all labs ordered are listed, but only abnormal results are displayed) Labs Reviewed - No data to display  EKG  EKG Interpretation None       Radiology No results found.  Procedures Procedures (including critical care time)  Medications Ordered in ED Medications - No data to display   Initial Impression / Assessment and Plan / ED Course  I have reviewed the triage vital signs and the nursing notes.  Pertinent labs & imaging results that were available during my care of the patient were reviewed by me and considered in my medical decision making (see chart for details).      Final Clinical Impressions(s) / ED Diagnoses   Final diagnoses:  Dermatitis    56 year old female presents today with a rash to her face.  This does not appear viral in nature, the disc does not appear to be any sort of Stevens-Johnson syndrome presentation.  Patient had a cracked lip and placed Neosporin on the lip and surrounding skin causing likely contact dermatitis.  Patient has no intraoral oral involvement, no other dermatomal involvement.  He is well-appearing, afebrile no acute distress.  Patient is instructed to avoid using hydrogen peroxide on her face, she will follow-up closely with her primary care provider in 2 days for reassessment and return to the emergency room immediately if she develops any new or worsening signs or symptoms.  Urged not to continue taking tumeric.  patient verbalized understanding and agreement to today's plan had no further questions or concerns  New Prescriptions Discharge Medication List as of 07/15/2016  5:11 PM      I personally performed  the services described in this documentation, which was scribed in my presence. The recorded information has been reviewed and is accurate.    Dawn Mechanic, PA-C 07/15/16 1831    Doug Sou, MD 07/16/16 [id number]

## 2016-07-15 NOTE — ED Triage Notes (Signed)
Pt has rash to R side of her mouth x 3 days. States L side of mouth is also sore. She has used antibiotic ointment with no relief. She also states her tongue hurts. Rates pain 6/10. Denies difficulty swallowing.

## 2016-07-15 NOTE — Discharge Instructions (Signed)
Please read attached information. If you experience any new or worsening signs or symptoms please return to the emergency room for evaluation. Please follow-up with your primary care provider or specialist as discussed.  °

## 2017-04-29 ENCOUNTER — Other Ambulatory Visit: Payer: Self-pay | Admitting: Orthopedic Surgery

## 2017-04-29 DIAGNOSIS — M4726 Other spondylosis with radiculopathy, lumbar region: Secondary | ICD-10-CM

## 2017-05-02 ENCOUNTER — Other Ambulatory Visit: Payer: Medicare Other

## 2017-05-08 ENCOUNTER — Ambulatory Visit
Admission: RE | Admit: 2017-05-08 | Discharge: 2017-05-08 | Disposition: A | Discharge status: Home / Self Care | Payer: Medicare Other | Source: Ambulatory Visit | Attending: Orthopedic Surgery | Admitting: Orthopedic Surgery

## 2017-05-08 DIAGNOSIS — M4726 Other spondylosis with radiculopathy, lumbar region: Secondary | ICD-10-CM

## 2017-06-15 ENCOUNTER — Ambulatory Visit: Payer: Medicare Other | Admitting: Advanced Practice Midwife

## 2017-06-26 ENCOUNTER — Ambulatory Visit: Payer: Medicare Other | Admitting: Obstetrics and Gynecology

## 2017-07-26 ENCOUNTER — Ambulatory Visit: Payer: Medicare Other | Admitting: Obstetrics and Gynecology

## 2017-11-08 ENCOUNTER — Encounter (HOSPITAL_COMMUNITY): Payer: Self-pay

## 2017-11-08 ENCOUNTER — Other Ambulatory Visit: Payer: Self-pay

## 2017-11-08 ENCOUNTER — Emergency Department (HOSPITAL_COMMUNITY)
Admission: EM | Admit: 2017-11-08 | Discharge: 2017-11-08 | Disposition: A | Discharge status: Home / Self Care | Payer: Medicare Other | Attending: Emergency Medicine | Admitting: Emergency Medicine

## 2017-11-08 DIAGNOSIS — R110 Nausea: Secondary | ICD-10-CM | POA: Insufficient documentation

## 2017-11-08 DIAGNOSIS — Z79899 Other long term (current) drug therapy: Secondary | ICD-10-CM | POA: Diagnosis not present

## 2017-11-08 DIAGNOSIS — G8929 Other chronic pain: Secondary | ICD-10-CM | POA: Diagnosis not present

## 2017-11-08 DIAGNOSIS — M5136 Other intervertebral disc degeneration, lumbar region: Secondary | ICD-10-CM | POA: Insufficient documentation

## 2017-11-08 DIAGNOSIS — Z87891 Personal history of nicotine dependence: Secondary | ICD-10-CM | POA: Diagnosis not present

## 2017-11-08 MED ORDER — ONDANSETRON 4 MG PO TBDP
4.0000 mg | ORAL_TABLET | Freq: Once | ORAL | Status: AC
  Administered 2017-11-08: 4 mg via ORAL
  Filled 2017-11-08: qty 1

## 2017-11-08 MED ORDER — ONDANSETRON HCL 4 MG PO TABS: 4.0000 mg | ORAL_TABLET | Freq: Four times a day (QID) | ORAL | 0 refills | Status: AC

## 2017-11-08 NOTE — ED Triage Notes (Signed)
Pt states that she is on pain medication for chronic back pain and takes "ameteza" to help with BMs. Pt states that today she went to go eat today and became nauseated and "gagging".  Pt states that she is concerned there is not enough food on her stomach to make her nauseated. Pt states that she got the nausea to pass by licking salt off of her hand.

## 2017-11-08 NOTE — ED Provider Notes (Signed)
[facility]-EMERGENCY DEPT Provider Note  CSN: [id number] Arrival date & time: 11/08/17  1723    History   Chief Complaint Chief Complaint  Patient presents with  . Nausea    HPI Dawn Chen is a 57 y.o. female with a medical history of chronic pain 2/2 DDD who presented to the ED for intermittent nausea x4 days. She states that she has had nausea on/off throughout the week which is typically resolved when she eats or has a bowel movement. Denies fever, chills, abdominal pain and changes in bowel habits from baseline. Endorses constipation due to opioids prescribed from pain, but is prescribe Amitiza which is useful. Patient denies recent travel, new foods or allergens. She states she was recently prescribed doxycycline for sinusitis and correlates her symptoms to when this began.   Past Medical History:  Diagnosis Date  . Anemia   . Anxiety   . Arthritis   . Carpal tunnel syndrome   . Chronic pain   . Depression   . Osteoarthritis   . Sleep apnea    Resolved d/t weight loss  . 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 Glenohumeral arthritis 07/30/2014  . Chronic lumbar pain 07/09/2012  . Lumbar degenerative disc disease 07/09/2012    Past Surgical History:  Procedure Laterality Date  . ABDOMINAL HYSTERECTOMY    . CARPAL TUNNEL RELEASE Bilateral 2001  . CHOLECYSTECTOMY    . GASTRIC BYPASS  2013  . GYNECOLOGIC CRYOSURGERY    . HYSTEROTOMY  2007  . JOINT REPLACEMENT Right 03/2013   total knee  . LUMBAR FUSION  2014  . SHOULDER ACROMIOPLASTY Right 2011  . SHOULDER ARTHROSCOPY WITH ROTATOR CUFF REPAIR Right 08/18/2014   Procedure: RIGHT SHOULDER  SUBSCAPULARIS REPAIR;  Surgeon: Jones BroomJustin Chandler, MD;  Location: [facility] OR;  Service: Orthopedics;  Laterality: Right;  Right shoulder subscapularis repair  . TOTAL SHOULDER ARTHROPLASTY Right 07/30/2014   Procedure: TOTAL SHOULDER ARTHROPLASTY;  Surgeon: Jones BroomJustin Chandler, MD;  Location: [facility] OR;  Service: Orthopedics;  Laterality: Right;  Right shoulder arthroplasty     OB History   None      Home Medications    Prior to Admission medications   Medication Sig Start Date End Date Taking? Authorizing Provider  AMITIZA 24 MCG capsule Take 24 mcg by mouth 2 (two) times a week.  11/04/17  Yes [provider]  Cyanocobalamin (B-12) 1500 MCG TBCR Take 1,500 mcg by mouth daily. In addition to the 1000mcg   Yes [provider]  cyclobenzaprine (FLEXERIL) 10 MG tablet TK 1 T PO TID PRN MUSCLE SPASMS 10/10/17  Yes [provider]  doxycycline (VIBRAMYCIN) 100 MG capsule TK 1 C PO BID FOR 10 DAYS 10/30/17  Yes [provider]  oxycodone (ROXICODONE) 30 MG immediate release tablet Take 30 mg by mouth every 2 (two) hours as needed for pain.  10/17/17  Yes [provider]  XTAMPZA ER 27 MG C12A TK ONE C AND SPRINKLE ENTIRE CONTENTS ON SOFT FOODS OR INTO A CUP THEN IMM PO Q 12 H 10/18/17  Yes [provider]  buPROPion (WELLBUTRIN XL) 150 MG 24 hr tablet Take 1 tablet (150 mg total) by mouth daily. Patient not taking: Reported on 11/08/2017 02/18/14   Elwin MochaWalden, Blair, MD  docusate sodium (COLACE) 100 MG capsule Take 1 capsule (100 mg total) by mouth 3 (three) times daily  as needed. Patient not taking: Reported on 11/08/2017 07/31/14   Jiles Harold, PA-C  ondansetron (ZOFRAN ODT) 4 MG disintegrating tablet Take 1 tablet (4 mg total) by mouth every 8 (eight) hours as needed for nausea or vomiting. Patient not taking: Reported on 11/08/2017 10/04/14   Tomasita Crumble, MD  oxyCODONE (ROXICODONE) 5 MG immediate release tablet 1-3 q 3-4 hrs prn pain Patient not taking: Reported on 11/08/2017 07/31/14   Jiles Harold, PA-C  QUEtiapine (SEROQUEL XR) 300 MG 24 hr tablet Take 2 tablets (600 mg total) by mouth at bedtime. Patient not taking: Reported on 11/08/2017 02/18/14    Elwin Mocha, MD  sertraline (ZOLOFT) 25 MG tablet Take 1 tablet (25 mg total) by mouth daily. Patient not taking: Reported on 11/08/2017 02/18/14   Elwin Mocha, MD    Family History Family History  Problem Relation Age of Onset  . Cancer Maternal Aunt   . Cancer Maternal Grandmother     Social History Social History   Tobacco Use  . Smoking status: Former Smoker    Packs/day: 0.50    Types: Cigarettes    Last attempt to quit: 09/08/2017    Years since quitting: 0.1  . Smokeless tobacco: Never Used  . Tobacco comment: Smoking varies  Substance Use Topics  . Alcohol use: No  . Drug use: No     Allergies   Diflucan [fluconazole] and Gabapentin   Review of Systems Review of Systems  Constitutional: Negative for chills and fever.  Respiratory: Negative.   Cardiovascular: Negative.   Gastrointestinal: Positive for constipation and nausea. Negative for abdominal distention, abdominal pain, blood in stool, diarrhea and vomiting.  Skin: Negative.      Physical Exam Updated Vital Signs BP (!) 140/91 (BP Location: Left Arm)   Pulse 81   Temp 98 F (36.7 C) (Oral)   Resp 17   Ht 5\' 8"  (1.727 m)   Wt 77.1 kg (170 lb)   SpO2 98%   BMI 25.85 kg/m   Physical Exam  Constitutional: She appears well-developed and well-nourished.  HENT:  Mouth/Throat: Uvula is midline, oropharynx is clear and moist and mucous membranes are normal.  Cardiovascular: Normal rate, regular rhythm and normal heart sounds.  No murmur heard. Pulmonary/Chest: Effort normal and breath sounds normal.  Abdominal: Soft. Normal appearance and bowel sounds are normal. There is no tenderness.  Nursing note and vitals reviewed.    ED Treatments / Results  Labs (all labs ordered are listed, but only abnormal results are displayed) Labs Reviewed - No data to display  EKG None  Radiology No results found.  Procedures Procedures (including critical care time)  Medications Ordered in  ED Medications - No data to display   Initial Impression / Assessment and Plan / ED Course  Triage vital signs and the nursing notes have been reviewed.  Pertinent labs & imaging results that were available during care of the patient were reviewed and considered in medical decision making (see chart for details).   Patient presents afebrile and is well appearing. She endorses intermittent nausea over the last few days that is relieved when she eats. Physical exam is normal. Patient is not currently endorsing nausea and has no episodes of vomiting. There are no s/s from exam or history that would indicate to run labs or imaging. Patient has constipation due to her chronic pain medications and nausea seems to coincide with patient not having a bowel movement for a couple of days which is likely cause.  Final Clinical Impressions(s) / ED Diagnoses  1. Nausea. Likely 2/2 constipation. Education provided on appropriate administration of Amitiza and encouraged to take daily to make BMs more regular. Prescribed Zofran for breakthrough nausea.  Dispo: Home. After thorough clinical evaluation, this patient is determined to be medically stable and can be safely discharged with the previously mentioned treatment and/or outpatient follow-up/referral(s). At this time, there are no other apparent medical conditions that require further screening, evaluation or treatment.  Final diagnoses:  None    ED Discharge Orders    None        Reva Bores 11/08/17 2121    Mancel Bale, MD 11/08/17 [phone]

## 2017-11-08 NOTE — Discharge Instructions (Addendum)
I have prescribed you Zofran which can be used for the nausea. I would recommend that you take Amitiza daily to help facilitate bowel movements which can also help with the nausea and its upstream effects.  Good luck with your upcoming surgeries!

## 2018-01-03 MED ORDER — ONDANSETRON 4 MG PO TBDP
4.00 | ORAL_TABLET | ORAL | Status: DC
Start: ? — End: 2018-01-03

## 2018-01-03 MED ORDER — VITAMIN B-12 1000 MCG PO TABS
1000.00 | ORAL_TABLET | ORAL | Status: DC
Start: 2018-01-05 — End: 2018-01-03

## 2018-01-03 MED ORDER — OXYCODONE HCL 5 MG PO TABS
20.00 | ORAL_TABLET | ORAL | Status: DC
Start: ? — End: 2018-01-03

## 2018-01-03 MED ORDER — POTASSIUM CHLORIDE IN NACL 20-0.9 MEQ/L-% IV SOLN
100.00 | INTRAVENOUS | Status: DC
Start: ? — End: 2018-01-03

## 2018-01-03 MED ORDER — MORPHINE SULFATE 2 MG/ML IJ SOLN
2.00 | INTRAMUSCULAR | Status: DC
Start: ? — End: 2018-01-03

## 2018-01-03 MED ORDER — SENNOSIDES-DOCUSATE SODIUM 8.6-50 MG PO TABS
1.00 | ORAL_TABLET | ORAL | Status: DC
Start: 2018-01-03 — End: 2018-01-03

## 2018-01-03 MED ORDER — CYCLOBENZAPRINE HCL 10 MG PO TABS
10.00 | ORAL_TABLET | ORAL | Status: DC
Start: 2018-01-03 — End: 2018-01-03

## 2018-01-03 MED ORDER — NALOXONE HCL 0.4 MG/ML IJ SOLN
0.10 | INTRAMUSCULAR | Status: DC
Start: ? — End: 2018-01-03

## 2018-01-03 MED ORDER — KETOROLAC TROMETHAMINE 30 MG/ML IJ SOLN
15.00 | INTRAMUSCULAR | Status: DC
Start: ? — End: 2018-01-03

## 2018-01-03 MED ORDER — PHENOL 1.4 % MT LIQD
1.00 | OROMUCOSAL | Status: DC
Start: ? — End: 2018-01-03

## 2018-01-03 MED ORDER — DIPHENHYDRAMINE HCL 25 MG PO CAPS
25.00 | ORAL_CAPSULE | ORAL | Status: DC
Start: ? — End: 2018-01-03

## 2018-01-03 MED ORDER — GENERIC EXTERNAL MEDICATION
Status: DC
Start: ? — End: 2018-01-03

## 2018-01-03 MED ORDER — ONDANSETRON HCL 4 MG/2ML IJ SOLN
4.00 | INTRAMUSCULAR | Status: DC
Start: ? — End: 2018-01-03

## 2018-01-03 MED ORDER — VITAMIN D3 125 MCG (5000 UT) PO TABS
5000.00 | ORAL_TABLET | ORAL | Status: DC
Start: 2018-01-09 — End: 2018-01-03

## 2018-01-03 MED ORDER — FERROUS SULFATE 325 (65 FE) MG PO TABS
325.00 | ORAL_TABLET | ORAL | Status: DC
Start: 2018-01-05 — End: 2018-01-03

## 2018-01-03 MED ORDER — ACETAMINOPHEN 325 MG PO TABS
650.00 | ORAL_TABLET | ORAL | Status: DC
Start: ? — End: 2018-01-03

## 2018-01-03 MED ORDER — DOCUSATE SODIUM 100 MG PO CAPS
100.00 | ORAL_CAPSULE | ORAL | Status: DC
Start: 2018-01-03 — End: 2018-01-03

## 2018-01-03 MED ORDER — DIPHENHYDRAMINE HCL 50 MG/ML IJ SOLN
25.00 | INTRAMUSCULAR | Status: DC
Start: ? — End: 2018-01-03

## 2019-02-27 ENCOUNTER — Other Ambulatory Visit: Payer: Self-pay | Admitting: Orthopedic Surgery

## 2019-02-27 DIAGNOSIS — M4316 Spondylolisthesis, lumbar region: Secondary | ICD-10-CM

## 2019-03-22 ENCOUNTER — Other Ambulatory Visit: Payer: Self-pay

## 2019-03-22 ENCOUNTER — Ambulatory Visit
Admission: RE | Admit: 2019-03-22 | Discharge: 2019-03-22 | Disposition: A | Discharge status: Home / Self Care | Payer: Medicare Other | Source: Ambulatory Visit | Attending: Orthopedic Surgery | Admitting: Orthopedic Surgery

## 2019-03-22 DIAGNOSIS — M4316 Spondylolisthesis, lumbar region: Secondary | ICD-10-CM

## 2019-04-29 ENCOUNTER — Encounter: Payer: Self-pay | Admitting: Podiatry

## 2019-04-29 ENCOUNTER — Ambulatory Visit: Payer: Medicare Other | Admitting: Podiatry

## 2019-04-29 ENCOUNTER — Other Ambulatory Visit: Payer: Self-pay

## 2019-04-29 VITALS — BP 118/92

## 2019-04-29 DIAGNOSIS — B351 Tinea unguium: Secondary | ICD-10-CM | POA: Diagnosis not present

## 2019-04-29 NOTE — Progress Notes (Signed)
Subjective:  Patient ID: Dawn Chen, female    DOB: [birth year],  MRN: [id number]  Chief Complaint  Patient presents with  . Nail Problem    pt is here for bil toenail fungus, pt also states that both of the toenail fungus has been going on for 10 plus years, pt has tried otc creams but not much has helped    59 y.o. female presents with the above complaint.  Patient is here for bilateral toenail fungus of bilateral hallux as well as third digit.  Patient states been going on for about 10 years and has progressively gotten worse.  Patient had a history of trauma to the toenail when she was really young.  She also had a history of removing the toenail by her podiatrist when she was really young.  It appears that on the right side the hallux toenail levels permanently removed partially.  She would like to know if there is anything that can be done for this fungal toenail.  She has tried over-the-counter cream but has not helped.  Patient states that she does not particularly have any pain in the toenail this is primarily due to the thickening of the toenail unable to cut them.   Review of Systems: Negative except as noted in the HPI. Denies N/V/F/Ch.  Past Medical History:  Diagnosis Date  . Anemia   . Anxiety   . Arthritis   . Carpal tunnel syndrome   . Chronic pain   . Depression   . Osteoarthritis   . Sleep apnea    Resolved d/t weight loss  . 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  AMITIZA 24 MCG capsule, Take 24 mcg by mouth 2 (two) times a week. , Disp: , Rfl:  .  buPROPion (WELLBUTRIN XL) 150 MG 24 hr tablet, Take 1 tablet (150 mg total) by mouth daily., Disp: 15 tablet, Rfl: 0 .  cefdinir (OMNICEF) 300 MG capsule, cefdinir 300 mg capsule, Disp: , Rfl:  .  conjugated estrogens (PREMARIN) vaginal cream, Premarin 0.625 mg/gram vaginal cream  INSERT 1/2 APPLICATORFUL VAGINALLY 2 TIMES A WEEK,  Disp: , Rfl:  .  Cyanocobalamin (B-12) 1500 MCG TBCR, Take 1,500 mcg by mouth daily. In addition to the , Disp: , Rfl:  .  cyclobenzaprine (FLEXERIL) 10 MG tablet, TK 1 T PO TID PRN MUSCLE SPASMS, Disp: , Rfl: 5 .  docusate sodium (COLACE) 100 MG capsule, Take 1 capsule (100 mg total) by mouth 3 (three) times daily as needed., Disp: 20 capsule, Rfl: 0 .  doxycycline (VIBRAMYCIN) 100 MG capsule, TK 1 C PO BID FOR 10 DAYS, Disp: , Rfl: 0 .  ferrous sulfate 325 (65 FE) MG tablet, Take by mouth., Disp: , Rfl:  .  ipratropium (ATROVENT) 0.03 % nasal spray, Place 2 sprays into both nostrils 2 (two) times daily., Disp: , Rfl:  .  meloxicam (MOBIC) 15 MG tablet, Take 15 mg by mouth daily., Disp: , Rfl:  .  Multiple Vitamin (MULTI-VITAMIN) tablet, Take by mouth., Disp: , Rfl:  .  nabumetone (RELAFEN) 750 MG tablet, Take 750 mg by mouth 2 (two) times daily., Disp: , Rfl:  .  omeprazole (PRILOSEC) 40 MG capsule, omeprazole 40 mg capsule,delayed release, Disp: , Rfl:  .  ondansetron (ZOFRAN ODT) 4 MG disintegrating tablet, Take 1 tablet (4 mg total) by mouth every 8 (eight) hours as needed for nausea or vomiting., Disp:  12 tablet, Rfl: 0 .  oxycodone (ROXICODONE) 30 MG immediate release tablet, Take 30 mg by mouth every 2 (two) hours as needed for pain. , Disp: , Rfl: 0 .  oxyCODONE (ROXICODONE) 5 MG immediate release tablet, 1-3 q 3-4 hrs prn pain, Disp: 60 tablet, Rfl: 0 .  predniSONE (STERAPRED UNI-PAK 21 TAB) 10 MG (21) TBPK tablet, FPD, Disp: , Rfl:  .  QUEtiapine (SEROQUEL XR) 300 MG 24 hr tablet, Take 2 tablets (600 mg total) by mouth at bedtime., Disp: 30 tablet, Rfl: 0 .  sertraline (ZOLOFT) 25 MG tablet, Take 1 tablet (25 mg total) by mouth daily., Disp: 15 tablet, Rfl: 0 .  triamcinolone cream (KENALOG) 0.1 %, triamcinolone acetonide 0.1 % topical cream, Disp: , Rfl:  .  XTAMPZA ER 27 MG C12A, TK ONE C AND SPRINKLE ENTIRE CONTENTS ON SOFT FOODS OR INTO A CUP THEN IMM PO Q 12 H, Disp: , Rfl:  0  Social History   Tobacco Use  Smoking Status Former Smoker  . Packs/day: 0.50  . Types: Cigarettes  . Quit date: 09/08/2017  . Years since quitting: 1.6  Smokeless Tobacco Never Used  Tobacco Comment   Smoking varies    Allergies  Allergen Reactions  . Diflucan [Fluconazole] Other (See Comments)    Headache   . Gabapentin     Pass out   . Hydrochlorothiazide Other (See Comments)    Dizzy  . Methadone Nausea And Vomiting  . Pregabalin Other (See Comments)    Unable to function   Objective:   Vitals:   04/29/19 0953  BP: (!) 118/92   There is no height or weight on file to calculate BMI. Constitutional Well developed. Well nourished.  Vascular Dorsalis pedis pulses palpable bilaterally. Posterior tibial pulses palpable bilaterally. Capillary refill normal to all digits.  No cyanosis or clubbing noted. Pedal hair growth normal.  Neurologic Normal speech. Oriented to person, place, and time. Epicritic sensation to light touch grossly present bilaterally.  Dermatologic  bilateral hallux and third digit thickening discoloration mycotic noted to the digits.  No pain on palpation.  Orthopedic: Normal joint ROM without pain or crepitus bilaterally. No visible deformities. No bony tenderness.   Radiographs: None Assessment:   1. Nail fungus   2. Onychomycosis due to dermatophyte    Plan:  Patient was evaluated and treated and all questions answered.  Bilateral hallux/third digit onychomycosis -Educated the patient on the etiology of onychomycosis and various treatment options associated with improving the fungal load.  I explained to the patient that there is 3 treatment options available to treat the onychomycosis including topical, p.o., laser treatment.  Patient elected to undergo p.o. options with Lamisil/terbinafine therapy.  In order for me to start the medication therapy, I explained to the patient the importance of evaluating the liver and obtaining the  liver function test.  Once the liver function test comes back normal I will start him on 23-month course of Lamisil therapy.  Patient understood all risk and would like to proceed with Lamisil therapy. -I will see the patient back in 4 months to evaluate the toenails.   No follow-ups on file.

## 2019-04-30 LAB — HEPATIC FUNCTION PANEL
ALT: 25 IU/L (ref 0–32)
AST: 27 IU/L (ref 0–40)
Albumin: 3.9 g/dL (ref 3.8–4.9)
Alkaline Phosphatase: 111 IU/L (ref 39–117)
Bilirubin Total: <0.2 mg/dL (ref 0.0–1.2)
Bilirubin, Direct: 0.07 mg/dL (ref 0.00–0.40)
Total Protein: 7.1 g/dL (ref 6.0–8.5)

## 2019-04-30 MED ORDER — TERBINAFINE HCL 250 MG PO TABS: 250.0000 mg | ORAL_TABLET | Freq: Every day | ORAL | 0 refills | Status: AC

## 2019-04-30 NOTE — Addendum Note (Signed)
Addended by: Nicholes Rough on: 04/30/2019 07:18 AM   Modules accepted: Orders

## 2019-09-18 ENCOUNTER — Telehealth: Payer: Self-pay | Admitting: Podiatry

## 2019-09-18 NOTE — Telephone Encounter (Signed)
Patient called and notified that Per Dr. Allena Katz, he will wait until her visit on 09/30/19 to discuss refills then.  She verbalized understanding

## 2019-09-18 NOTE — Telephone Encounter (Signed)
Pt called wanting a refill on lamisil please advise

## 2019-09-30 ENCOUNTER — Encounter: Payer: Self-pay | Admitting: Podiatry

## 2019-09-30 ENCOUNTER — Other Ambulatory Visit: Payer: Self-pay

## 2019-09-30 ENCOUNTER — Ambulatory Visit: Payer: Medicare Other | Admitting: Podiatry

## 2019-09-30 DIAGNOSIS — B351 Tinea unguium: Secondary | ICD-10-CM | POA: Diagnosis not present

## 2019-09-30 NOTE — Progress Notes (Signed)
Subjective:  Patient ID: Dawn Chen, female    DOB: March 27, 1961,  MRN: [id number]  Chief Complaint  Patient presents with  . Nail Problem    pt is here for a f/u on lamisil treatment, pt states that she is doing a lot better since the last time she was here, pt also states that the left big toenail, as well as the right 3rd toenail still has some toenail fungus.    59 y.o. female presents with the above complaint.  Patient is here for follow-up of bilateral hallux as well as third digit onychomycosis.  Patient states it is doing really well.  Her nails are improving on Lamisil therapy.  She has just completed 36-month course.  She would like to know if she is to start with another 4-month course.   Review of Systems: Negative except as noted in the HPI. Denies N/V/F/Ch.  Past Medical History:  Diagnosis Date  . Anemia   . Anxiety   . Arthritis   . Carpal tunnel syndrome   . Chronic pain   . Depression   . Osteoarthritis   . Sleep apnea    Resolved d/t weight loss  . 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  AMITIZA 24 MCG capsule, Take 24 mcg by mouth 2 (two) times a week. , Disp: , Rfl:  .  buPROPion (WELLBUTRIN XL) 150 MG 24 hr tablet, Take 1 tablet (150 mg total) by mouth daily., Disp: 15 tablet, Rfl: 0 .  cefdinir (OMNICEF) 300 MG capsule, cefdinir 300 mg capsule, Disp: , Rfl:  .  conjugated estrogens (PREMARIN) vaginal cream, Premarin 0.625 mg/gram vaginal cream  INSERT 1/2 APPLICATORFUL VAGINALLY 2 TIMES A WEEK, Disp: , Rfl:  .  Cyanocobalamin (B-12) 1500 MCG TBCR, Take 1,500 mcg by mouth daily. In addition to the , Disp: , Rfl:  .  cyclobenzaprine (FLEXERIL) 10 MG tablet, TK 1 T PO TID PRN MUSCLE SPASMS, Disp: , Rfl: 5 .  docusate sodium (COLACE) 100 MG capsule, Take 1 capsule (100 mg total) by mouth 3 (three) times daily as needed., Disp: 20 capsule, Rfl: 0 .  doxycycline  (VIBRAMYCIN) 100 MG capsule, TK 1 C PO BID FOR 10 DAYS, Disp: , Rfl: 0 .  DULoxetine (CYMBALTA) 30 MG capsule, Take 30 mg by mouth daily., Disp: , Rfl:  .  ferrous sulfate 325 (65 FE) MG tablet, Take by mouth., Disp: , Rfl:  .  ipratropium (ATROVENT) 0.03 % nasal spray, Place 2 sprays into both nostrils 2 (two) times daily., Disp: , Rfl:  .  meloxicam (MOBIC) 15 MG tablet, Take 15 mg by mouth daily., Disp: , Rfl:  .  Multiple Vitamin (MULTI-VITAMIN) tablet, Take by mouth., Disp: , Rfl:  .  nabumetone (RELAFEN) 750 MG tablet, Take 750 mg by mouth 2 (two) times daily., Disp: , Rfl:  .  omeprazole (PRILOSEC) 40 MG capsule, omeprazole 40 mg capsule,delayed release, Disp: , Rfl:  .  ondansetron (ZOFRAN ODT) 4 MG disintegrating tablet, Take 1 tablet (4 mg total) by mouth every 8 (eight) hours as needed for nausea or vomiting., Disp: 12 tablet, Rfl: 0 .  oxycodone (ROXICODONE) 30 MG immediate release tablet, Take 30 mg by mouth every 2 (two) hours as needed for pain. , Disp: , Rfl: 0 .  oxyCODONE (ROXICODONE) 5 MG immediate release tablet, 1-3 q 3-4 hrs prn pain, Disp: 60 tablet, Rfl: 0 .  predniSONE (  STERAPRED UNI-PAK 21 TAB) 10 MG (21) TBPK tablet, FPD, Disp: , Rfl:  .  QUEtiapine (SEROQUEL XR) 300 MG 24 hr tablet, Take 2 tablets (600 mg total) by mouth at bedtime., Disp: 30 tablet, Rfl: 0 .  sertraline (ZOLOFT) 25 MG tablet, Take 1 tablet (25 mg total) by mouth daily., Disp: 15 tablet, Rfl: 0 .  terbinafine (LAMISIL) 250 MG tablet, Take 1 tablet (250 mg total) by mouth daily., Disp: 90 tablet, Rfl: 0 .  triamcinolone cream (KENALOG) 0.1 %, triamcinolone acetonide 0.1 % topical cream, Disp: , Rfl:  .  XTAMPZA ER 27 MG C12A, TK ONE C AND SPRINKLE ENTIRE CONTENTS ON SOFT FOODS OR INTO A CUP THEN IMM PO Q 12 H, Disp: , Rfl: 0  Social History   Tobacco Use  Smoking Status Former Smoker  . Packs/day: 0.50  . Types: Cigarettes  . Quit date: 09/08/2017  . Years since quitting: 2.0  Smokeless Tobacco  Never Used  Tobacco Comment   Smoking varies    Allergies  Allergen Reactions  . Diflucan [Fluconazole] Other (See Comments)    Headache   . Gabapentin     Pass out   . Hydrochlorothiazide Other (See Comments)    Dizzy  . Methadone Nausea And Vomiting  . Pregabalin Other (See Comments)    Unable to function   Objective:   There were no vitals filed for this visit. There is no height or weight on file to calculate BMI. Constitutional Well developed. Well nourished.  Vascular Dorsalis pedis pulses palpable bilaterally. Posterior tibial pulses palpable bilaterally. Capillary refill normal to all digits.  No cyanosis or clubbing noted. Pedal hair growth normal.  Neurologic Normal speech. Oriented to person, place, and time. Epicritic sensation to light touch grossly present bilaterally.  Dermatologic  bilateral hallux and third digit thickening discoloration mycotic noted to the digits.  Improving no pain on palpation.  Orthopedic: Normal joint ROM without pain or crepitus bilaterally. No visible deformities. No bony tenderness.   Radiographs: None Assessment:   1. Nail fungus   2. Onychomycosis due to dermatophyte    Plan:  Patient was evaluated and treated and all questions answered.  Bilateral hallux/third digit onychomycosis improving -Clinically patient's nails are improving considerably.  I will continue another course/72-month course of Lamisil therapy.  I will obtain another liver function study if normal will plan on doing another 16-month course..  Patient understood all risk and would like to proceed with Lamisil therapy. -A prescription for new liver function test was sent. -I will see the patient back in 4 months to evaluate the toenails.   No follow-ups on file.

## 2019-10-01 LAB — HEPATIC FUNCTION PANEL
ALT: 45 IU/L — ABNORMAL HIGH (ref 0–32)
AST: 40 IU/L (ref 0–40)
Albumin: 3.9 g/dL (ref 3.8–4.9)
Alkaline Phosphatase: 111 IU/L (ref 48–121)
Bilirubin Total: 0.2 mg/dL (ref 0.0–1.2)
Bilirubin, Direct: 0.06 mg/dL (ref 0.00–0.40)
Total Protein: 6.7 g/dL (ref 6.0–8.5)

## 2019-10-02 ENCOUNTER — Telehealth: Payer: Self-pay | Admitting: *Deleted

## 2019-10-02 NOTE — Telephone Encounter (Signed)
I copied Dr. Eliane Decree review of results and orders from Results - InBasket. I informed pt of Dr. Eliane Decree review of results and orders and faxed copy of labs to Dr. Modesto Charon.

## 2019-10-02 NOTE — Telephone Encounter (Signed)
Candelaria Stagers, DPM  Marissa Nestle, RN Hi Vikki Ports   So this patient has a positive liver function study. However the phone number that was listed on the chart it does not appear to be hers. I would like for her to follow-up with her primary care physician before started on Lamisil therapy. It was a little bit elevated from previous Lamisil therapy.   I will hold off on starting her Lamisil therapy if you mind letting her know that  Thank you

## 2020-01-16 ENCOUNTER — Emergency Department
Admission: EM | Admit: 2020-01-16 | Discharge: 2020-01-16 | Disposition: A | Discharge status: Home / Self Care | Payer: Medicare Other | Attending: Emergency Medicine | Admitting: Emergency Medicine

## 2020-01-16 ENCOUNTER — Other Ambulatory Visit: Payer: Self-pay

## 2020-01-16 ENCOUNTER — Emergency Department: Payer: Medicare Other

## 2020-01-16 ENCOUNTER — Encounter: Payer: Self-pay | Admitting: Emergency Medicine

## 2020-01-16 ENCOUNTER — Emergency Department
Admission: EM | Admit: 2020-01-16 | Discharge: 2020-01-16 | Discharge status: Against Medical Advice | Payer: No Typology Code available for payment source

## 2020-01-16 DIAGNOSIS — S39012A Strain of muscle, fascia and tendon of lower back, initial encounter: Secondary | ICD-10-CM

## 2020-01-16 DIAGNOSIS — Z96651 Presence of right artificial knee joint: Secondary | ICD-10-CM | POA: Diagnosis not present

## 2020-01-16 DIAGNOSIS — Z87891 Personal history of nicotine dependence: Secondary | ICD-10-CM | POA: Insufficient documentation

## 2020-01-16 DIAGNOSIS — Y92410 Unspecified street and highway as the place of occurrence of the external cause: Secondary | ICD-10-CM | POA: Diagnosis not present

## 2020-01-16 DIAGNOSIS — Z96611 Presence of right artificial shoulder joint: Secondary | ICD-10-CM | POA: Insufficient documentation

## 2020-01-16 DIAGNOSIS — S3992XA Unspecified injury of lower back, initial encounter: Secondary | ICD-10-CM | POA: Diagnosis present

## 2020-01-16 DIAGNOSIS — Z79899 Other long term (current) drug therapy: Secondary | ICD-10-CM | POA: Diagnosis not present

## 2020-01-16 DIAGNOSIS — Y93I9 Activity, other involving external motion: Secondary | ICD-10-CM | POA: Insufficient documentation

## 2020-01-16 MED ORDER — PREDNISONE 10 MG (21) PO TBPK: ORAL_TABLET | ORAL | 0 refills | Status: DC
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
## 2020-01-16 NOTE — ED Notes (Signed)
Patient was discharged by Greig Right PA-C
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
## 2020-02-03 ENCOUNTER — Ambulatory Visit: Payer: Medicare Other | Admitting: Podiatry

## 2020-04-16 DIAGNOSIS — Z79891 Long term (current) use of opiate analgesic: Secondary | ICD-10-CM | POA: Diagnosis not present

## 2020-04-16 DIAGNOSIS — M19012 Primary osteoarthritis, left shoulder: Secondary | ICD-10-CM | POA: Diagnosis not present

## 2020-04-16 DIAGNOSIS — M19011 Primary osteoarthritis, right shoulder: Secondary | ICD-10-CM | POA: Diagnosis not present

## 2020-04-16 DIAGNOSIS — M4316 Spondylolisthesis, lumbar region: Secondary | ICD-10-CM | POA: Diagnosis not present

## 2020-04-16 DIAGNOSIS — F17210 Nicotine dependence, cigarettes, uncomplicated: Secondary | ICD-10-CM | POA: Diagnosis not present

## 2020-04-16 DIAGNOSIS — Z79899 Other long term (current) drug therapy: Secondary | ICD-10-CM | POA: Diagnosis not present

## 2020-04-16 DIAGNOSIS — G894 Chronic pain syndrome: Secondary | ICD-10-CM | POA: Diagnosis not present

## 2020-04-18 DIAGNOSIS — Z20822 Contact with and (suspected) exposure to covid-19: Secondary | ICD-10-CM | POA: Diagnosis not present

## 2020-05-17 DIAGNOSIS — G894 Chronic pain syndrome: Secondary | ICD-10-CM | POA: Diagnosis not present

## 2020-05-17 DIAGNOSIS — M4316 Spondylolisthesis, lumbar region: Secondary | ICD-10-CM | POA: Diagnosis not present

## 2020-05-17 DIAGNOSIS — M19011 Primary osteoarthritis, right shoulder: Secondary | ICD-10-CM | POA: Diagnosis not present

## 2020-05-17 DIAGNOSIS — Z79899 Other long term (current) drug therapy: Secondary | ICD-10-CM | POA: Diagnosis not present

## 2020-05-17 DIAGNOSIS — Z79891 Long term (current) use of opiate analgesic: Secondary | ICD-10-CM | POA: Diagnosis not present

## 2020-05-17 DIAGNOSIS — M19012 Primary osteoarthritis, left shoulder: Secondary | ICD-10-CM | POA: Diagnosis not present

## 2020-05-17 DIAGNOSIS — F17210 Nicotine dependence, cigarettes, uncomplicated: Secondary | ICD-10-CM | POA: Diagnosis not present

## 2020-05-27 DIAGNOSIS — G8929 Other chronic pain: Secondary | ICD-10-CM | POA: Diagnosis not present

## 2020-05-27 DIAGNOSIS — D509 Iron deficiency anemia, unspecified: Secondary | ICD-10-CM | POA: Diagnosis not present

## 2020-05-27 DIAGNOSIS — E785 Hyperlipidemia, unspecified: Secondary | ICD-10-CM | POA: Diagnosis not present

## 2020-05-27 DIAGNOSIS — M4726 Other spondylosis with radiculopathy, lumbar region: Secondary | ICD-10-CM | POA: Diagnosis not present

## 2020-05-27 DIAGNOSIS — I10 Essential (primary) hypertension: Secondary | ICD-10-CM | POA: Diagnosis not present

## 2020-06-14 DIAGNOSIS — Z79891 Long term (current) use of opiate analgesic: Secondary | ICD-10-CM | POA: Diagnosis not present

## 2020-06-14 DIAGNOSIS — M4316 Spondylolisthesis, lumbar region: Secondary | ICD-10-CM | POA: Diagnosis not present

## 2020-06-14 DIAGNOSIS — G894 Chronic pain syndrome: Secondary | ICD-10-CM | POA: Diagnosis not present

## 2020-06-14 DIAGNOSIS — M19012 Primary osteoarthritis, left shoulder: Secondary | ICD-10-CM | POA: Diagnosis not present

## 2020-06-14 DIAGNOSIS — Z79899 Other long term (current) drug therapy: Secondary | ICD-10-CM | POA: Diagnosis not present

## 2020-06-14 DIAGNOSIS — F17210 Nicotine dependence, cigarettes, uncomplicated: Secondary | ICD-10-CM | POA: Diagnosis not present

## 2020-06-14 DIAGNOSIS — M19011 Primary osteoarthritis, right shoulder: Secondary | ICD-10-CM | POA: Diagnosis not present

## 2020-07-11 DIAGNOSIS — M19011 Primary osteoarthritis, right shoulder: Secondary | ICD-10-CM | POA: Diagnosis not present

## 2020-07-11 DIAGNOSIS — M19012 Primary osteoarthritis, left shoulder: Secondary | ICD-10-CM | POA: Diagnosis not present

## 2020-07-11 DIAGNOSIS — M4316 Spondylolisthesis, lumbar region: Secondary | ICD-10-CM | POA: Diagnosis not present

## 2020-07-11 DIAGNOSIS — M129 Arthropathy, unspecified: Secondary | ICD-10-CM | POA: Diagnosis not present

## 2020-07-11 DIAGNOSIS — Z1159 Encounter for screening for other viral diseases: Secondary | ICD-10-CM | POA: Diagnosis not present

## 2020-07-11 DIAGNOSIS — F17210 Nicotine dependence, cigarettes, uncomplicated: Secondary | ICD-10-CM | POA: Diagnosis not present

## 2020-07-11 DIAGNOSIS — Z79899 Other long term (current) drug therapy: Secondary | ICD-10-CM | POA: Diagnosis not present

## 2020-07-11 DIAGNOSIS — G894 Chronic pain syndrome: Secondary | ICD-10-CM | POA: Diagnosis not present

## 2020-07-11 DIAGNOSIS — E559 Vitamin D deficiency, unspecified: Secondary | ICD-10-CM | POA: Diagnosis not present

## 2020-07-15 DIAGNOSIS — E785 Hyperlipidemia, unspecified: Secondary | ICD-10-CM | POA: Diagnosis not present

## 2020-07-15 DIAGNOSIS — D509 Iron deficiency anemia, unspecified: Secondary | ICD-10-CM | POA: Diagnosis not present

## 2020-07-15 DIAGNOSIS — I10 Essential (primary) hypertension: Secondary | ICD-10-CM | POA: Diagnosis not present

## 2020-07-15 DIAGNOSIS — Z79891 Long term (current) use of opiate analgesic: Secondary | ICD-10-CM | POA: Diagnosis not present

## 2020-07-15 DIAGNOSIS — M4726 Other spondylosis with radiculopathy, lumbar region: Secondary | ICD-10-CM | POA: Diagnosis not present

## 2020-07-15 DIAGNOSIS — G894 Chronic pain syndrome: Secondary | ICD-10-CM | POA: Diagnosis not present

## 2020-07-15 DIAGNOSIS — G8929 Other chronic pain: Secondary | ICD-10-CM | POA: Diagnosis not present

## 2020-07-20 DIAGNOSIS — M545 Low back pain, unspecified: Secondary | ICD-10-CM | POA: Diagnosis not present

## 2020-07-20 DIAGNOSIS — M4726 Other spondylosis with radiculopathy, lumbar region: Secondary | ICD-10-CM | POA: Diagnosis not present

## 2020-07-20 DIAGNOSIS — M4316 Spondylolisthesis, lumbar region: Secondary | ICD-10-CM | POA: Diagnosis not present

## 2020-07-20 DIAGNOSIS — M4326 Fusion of spine, lumbar region: Secondary | ICD-10-CM | POA: Diagnosis not present

## 2020-07-20 DIAGNOSIS — M546 Pain in thoracic spine: Secondary | ICD-10-CM | POA: Diagnosis not present

## 2020-07-30 ENCOUNTER — Other Ambulatory Visit: Payer: Self-pay | Admitting: Orthopaedic Surgery

## 2020-07-30 DIAGNOSIS — M4316 Spondylolisthesis, lumbar region: Secondary | ICD-10-CM

## 2020-08-13 DIAGNOSIS — F17210 Nicotine dependence, cigarettes, uncomplicated: Secondary | ICD-10-CM | POA: Diagnosis not present

## 2020-08-13 DIAGNOSIS — M4316 Spondylolisthesis, lumbar region: Secondary | ICD-10-CM | POA: Diagnosis not present

## 2020-08-13 DIAGNOSIS — G894 Chronic pain syndrome: Secondary | ICD-10-CM | POA: Diagnosis not present

## 2020-08-13 DIAGNOSIS — M19011 Primary osteoarthritis, right shoulder: Secondary | ICD-10-CM | POA: Diagnosis not present

## 2020-08-13 DIAGNOSIS — M19012 Primary osteoarthritis, left shoulder: Secondary | ICD-10-CM | POA: Diagnosis not present

## 2020-08-13 DIAGNOSIS — Z79899 Other long term (current) drug therapy: Secondary | ICD-10-CM | POA: Diagnosis not present

## 2020-08-14 DIAGNOSIS — Z79891 Long term (current) use of opiate analgesic: Secondary | ICD-10-CM | POA: Diagnosis not present

## 2020-08-14 DIAGNOSIS — G894 Chronic pain syndrome: Secondary | ICD-10-CM | POA: Diagnosis not present

## 2020-08-15 ENCOUNTER — Other Ambulatory Visit: Payer: Self-pay

## 2020-08-15 ENCOUNTER — Ambulatory Visit
Admission: RE | Admit: 2020-08-15 | Discharge: 2020-08-15 | Disposition: A | Discharge status: Home / Self Care | Payer: Medicare Other | Source: Ambulatory Visit | Attending: Orthopaedic Surgery | Admitting: Orthopaedic Surgery

## 2020-08-15 DIAGNOSIS — M545 Low back pain, unspecified: Secondary | ICD-10-CM | POA: Diagnosis not present

## 2020-08-15 DIAGNOSIS — M4316 Spondylolisthesis, lumbar region: Secondary | ICD-10-CM

## 2020-08-15 DIAGNOSIS — M48061 Spinal stenosis, lumbar region without neurogenic claudication: Secondary | ICD-10-CM | POA: Diagnosis not present

## 2020-08-19 DIAGNOSIS — M4326 Fusion of spine, lumbar region: Secondary | ICD-10-CM | POA: Diagnosis not present

## 2020-08-19 DIAGNOSIS — M961 Postlaminectomy syndrome, not elsewhere classified: Secondary | ICD-10-CM | POA: Diagnosis not present

## 2020-08-24 DIAGNOSIS — R600 Localized edema: Secondary | ICD-10-CM | POA: Diagnosis not present

## 2020-08-24 DIAGNOSIS — T39391A Poisoning by other nonsteroidal anti-inflammatory drugs [NSAID], accidental (unintentional), initial encounter: Secondary | ICD-10-CM | POA: Diagnosis not present

## 2020-09-01 ENCOUNTER — Other Ambulatory Visit: Payer: Self-pay | Admitting: Orthopedic Surgery

## 2020-09-01 DIAGNOSIS — M4326 Fusion of spine, lumbar region: Secondary | ICD-10-CM

## 2020-09-01 DIAGNOSIS — M546 Pain in thoracic spine: Secondary | ICD-10-CM

## 2020-09-02 DIAGNOSIS — Z01818 Encounter for other preprocedural examination: Secondary | ICD-10-CM | POA: Diagnosis not present

## 2020-09-11 ENCOUNTER — Other Ambulatory Visit: Payer: Self-pay

## 2020-09-11 ENCOUNTER — Ambulatory Visit
Admission: RE | Admit: 2020-09-11 | Discharge: 2020-09-11 | Disposition: A | Discharge status: Home / Self Care | Payer: Medicare Other | Source: Ambulatory Visit | Attending: Orthopedic Surgery | Admitting: Orthopedic Surgery

## 2020-09-11 DIAGNOSIS — M546 Pain in thoracic spine: Secondary | ICD-10-CM

## 2020-09-11 DIAGNOSIS — M4326 Fusion of spine, lumbar region: Secondary | ICD-10-CM

## 2020-09-11 DIAGNOSIS — Z981 Arthrodesis status: Secondary | ICD-10-CM | POA: Diagnosis not present

## 2020-09-11 DIAGNOSIS — I700 Atherosclerosis of aorta: Secondary | ICD-10-CM | POA: Diagnosis not present

## 2020-09-11 DIAGNOSIS — M545 Low back pain, unspecified: Secondary | ICD-10-CM | POA: Diagnosis not present

## 2020-09-13 DIAGNOSIS — F17210 Nicotine dependence, cigarettes, uncomplicated: Secondary | ICD-10-CM | POA: Diagnosis not present

## 2020-09-13 DIAGNOSIS — M19012 Primary osteoarthritis, left shoulder: Secondary | ICD-10-CM | POA: Diagnosis not present

## 2020-09-13 DIAGNOSIS — M19011 Primary osteoarthritis, right shoulder: Secondary | ICD-10-CM | POA: Diagnosis not present

## 2020-09-13 DIAGNOSIS — G894 Chronic pain syndrome: Secondary | ICD-10-CM | POA: Diagnosis not present

## 2020-09-13 DIAGNOSIS — M4316 Spondylolisthesis, lumbar region: Secondary | ICD-10-CM | POA: Diagnosis not present

## 2020-09-13 DIAGNOSIS — Z79899 Other long term (current) drug therapy: Secondary | ICD-10-CM | POA: Diagnosis not present

## 2020-09-14 DIAGNOSIS — G894 Chronic pain syndrome: Secondary | ICD-10-CM | POA: Diagnosis not present

## 2020-09-14 DIAGNOSIS — Z79891 Long term (current) use of opiate analgesic: Secondary | ICD-10-CM | POA: Diagnosis not present

## 2020-09-16 DIAGNOSIS — M4726 Other spondylosis with radiculopathy, lumbar region: Secondary | ICD-10-CM | POA: Diagnosis not present

## 2020-09-16 DIAGNOSIS — M961 Postlaminectomy syndrome, not elsewhere classified: Secondary | ICD-10-CM | POA: Diagnosis not present

## 2020-09-16 DIAGNOSIS — M4316 Spondylolisthesis, lumbar region: Secondary | ICD-10-CM | POA: Diagnosis not present

## 2020-10-13 DIAGNOSIS — Z79899 Other long term (current) drug therapy: Secondary | ICD-10-CM | POA: Diagnosis not present

## 2020-10-13 DIAGNOSIS — G894 Chronic pain syndrome: Secondary | ICD-10-CM | POA: Diagnosis not present

## 2020-10-13 DIAGNOSIS — F17210 Nicotine dependence, cigarettes, uncomplicated: Secondary | ICD-10-CM | POA: Diagnosis not present

## 2020-10-13 DIAGNOSIS — M19011 Primary osteoarthritis, right shoulder: Secondary | ICD-10-CM | POA: Diagnosis not present

## 2020-10-13 DIAGNOSIS — M4316 Spondylolisthesis, lumbar region: Secondary | ICD-10-CM | POA: Diagnosis not present

## 2020-10-14 DIAGNOSIS — G894 Chronic pain syndrome: Secondary | ICD-10-CM | POA: Diagnosis not present

## 2020-10-14 DIAGNOSIS — Z79891 Long term (current) use of opiate analgesic: Secondary | ICD-10-CM | POA: Diagnosis not present

## 2020-10-25 DIAGNOSIS — J0100 Acute maxillary sinusitis, unspecified: Secondary | ICD-10-CM | POA: Diagnosis not present

## 2020-11-12 DIAGNOSIS — G894 Chronic pain syndrome: Secondary | ICD-10-CM | POA: Diagnosis not present

## 2020-11-12 DIAGNOSIS — M4316 Spondylolisthesis, lumbar region: Secondary | ICD-10-CM | POA: Diagnosis not present

## 2020-11-12 DIAGNOSIS — M19011 Primary osteoarthritis, right shoulder: Secondary | ICD-10-CM | POA: Diagnosis not present

## 2020-11-12 DIAGNOSIS — F17210 Nicotine dependence, cigarettes, uncomplicated: Secondary | ICD-10-CM | POA: Diagnosis not present

## 2020-11-12 DIAGNOSIS — Z79899 Other long term (current) drug therapy: Secondary | ICD-10-CM | POA: Diagnosis not present

## 2020-11-14 DIAGNOSIS — Z79891 Long term (current) use of opiate analgesic: Secondary | ICD-10-CM | POA: Diagnosis not present

## 2020-11-14 DIAGNOSIS — G894 Chronic pain syndrome: Secondary | ICD-10-CM | POA: Diagnosis not present

## 2020-12-07 DIAGNOSIS — M5136 Other intervertebral disc degeneration, lumbar region: Secondary | ICD-10-CM | POA: Diagnosis not present

## 2020-12-07 DIAGNOSIS — M4316 Spondylolisthesis, lumbar region: Secondary | ICD-10-CM | POA: Diagnosis not present

## 2020-12-07 DIAGNOSIS — M961 Postlaminectomy syndrome, not elsewhere classified: Secondary | ICD-10-CM | POA: Diagnosis not present

## 2020-12-07 DIAGNOSIS — M47816 Spondylosis without myelopathy or radiculopathy, lumbar region: Secondary | ICD-10-CM | POA: Diagnosis not present

## 2020-12-12 DIAGNOSIS — M4316 Spondylolisthesis, lumbar region: Secondary | ICD-10-CM | POA: Diagnosis not present

## 2020-12-12 DIAGNOSIS — Z87891 Personal history of nicotine dependence: Secondary | ICD-10-CM | POA: Diagnosis not present

## 2020-12-12 DIAGNOSIS — G894 Chronic pain syndrome: Secondary | ICD-10-CM | POA: Diagnosis not present

## 2020-12-12 DIAGNOSIS — F17210 Nicotine dependence, cigarettes, uncomplicated: Secondary | ICD-10-CM | POA: Diagnosis not present

## 2020-12-12 DIAGNOSIS — Z79899 Other long term (current) drug therapy: Secondary | ICD-10-CM | POA: Diagnosis not present

## 2020-12-12 DIAGNOSIS — M19011 Primary osteoarthritis, right shoulder: Secondary | ICD-10-CM | POA: Diagnosis not present

## 2020-12-15 DIAGNOSIS — G894 Chronic pain syndrome: Secondary | ICD-10-CM | POA: Diagnosis not present

## 2020-12-15 DIAGNOSIS — Z79891 Long term (current) use of opiate analgesic: Secondary | ICD-10-CM | POA: Diagnosis not present

## 2020-12-22 DIAGNOSIS — L259 Unspecified contact dermatitis, unspecified cause: Secondary | ICD-10-CM | POA: Diagnosis not present

## 2020-12-22 DIAGNOSIS — Z2821 Immunization not carried out because of patient refusal: Secondary | ICD-10-CM | POA: Diagnosis not present

## 2020-12-22 DIAGNOSIS — M79629 Pain in unspecified upper arm: Secondary | ICD-10-CM | POA: Diagnosis not present

## 2020-12-22 DIAGNOSIS — L810 Postinflammatory hyperpigmentation: Secondary | ICD-10-CM | POA: Diagnosis not present

## 2021-01-10 DIAGNOSIS — Z79899 Other long term (current) drug therapy: Secondary | ICD-10-CM | POA: Diagnosis not present

## 2021-01-10 DIAGNOSIS — M4316 Spondylolisthesis, lumbar region: Secondary | ICD-10-CM | POA: Diagnosis not present

## 2021-01-10 DIAGNOSIS — Z87891 Personal history of nicotine dependence: Secondary | ICD-10-CM | POA: Diagnosis not present

## 2021-01-10 DIAGNOSIS — F17210 Nicotine dependence, cigarettes, uncomplicated: Secondary | ICD-10-CM | POA: Diagnosis not present

## 2021-01-10 DIAGNOSIS — G894 Chronic pain syndrome: Secondary | ICD-10-CM | POA: Diagnosis not present

## 2021-01-10 DIAGNOSIS — M19011 Primary osteoarthritis, right shoulder: Secondary | ICD-10-CM | POA: Diagnosis not present

## 2021-01-14 DIAGNOSIS — G894 Chronic pain syndrome: Secondary | ICD-10-CM | POA: Diagnosis not present

## 2021-01-14 DIAGNOSIS — Z79891 Long term (current) use of opiate analgesic: Secondary | ICD-10-CM | POA: Diagnosis not present

## 2021-02-09 DIAGNOSIS — M5136 Other intervertebral disc degeneration, lumbar region: Secondary | ICD-10-CM | POA: Diagnosis not present

## 2021-02-09 DIAGNOSIS — M4316 Spondylolisthesis, lumbar region: Secondary | ICD-10-CM | POA: Diagnosis not present

## 2021-02-10 DIAGNOSIS — Z79899 Other long term (current) drug therapy: Secondary | ICD-10-CM | POA: Diagnosis not present

## 2021-02-10 DIAGNOSIS — F17210 Nicotine dependence, cigarettes, uncomplicated: Secondary | ICD-10-CM | POA: Diagnosis not present

## 2021-02-10 DIAGNOSIS — M19011 Primary osteoarthritis, right shoulder: Secondary | ICD-10-CM | POA: Diagnosis not present

## 2021-02-10 DIAGNOSIS — Z87891 Personal history of nicotine dependence: Secondary | ICD-10-CM | POA: Diagnosis not present

## 2021-02-10 DIAGNOSIS — M4316 Spondylolisthesis, lumbar region: Secondary | ICD-10-CM | POA: Diagnosis not present

## 2021-02-10 DIAGNOSIS — G894 Chronic pain syndrome: Secondary | ICD-10-CM | POA: Diagnosis not present

## 2021-02-14 DIAGNOSIS — Z79891 Long term (current) use of opiate analgesic: Secondary | ICD-10-CM | POA: Diagnosis not present

## 2021-02-14 DIAGNOSIS — G894 Chronic pain syndrome: Secondary | ICD-10-CM | POA: Diagnosis not present

## 2021-03-11 DIAGNOSIS — M19011 Primary osteoarthritis, right shoulder: Secondary | ICD-10-CM | POA: Diagnosis not present

## 2021-03-11 DIAGNOSIS — F17210 Nicotine dependence, cigarettes, uncomplicated: Secondary | ICD-10-CM | POA: Diagnosis not present

## 2021-03-11 DIAGNOSIS — G894 Chronic pain syndrome: Secondary | ICD-10-CM | POA: Diagnosis not present

## 2021-03-11 DIAGNOSIS — M4316 Spondylolisthesis, lumbar region: Secondary | ICD-10-CM | POA: Diagnosis not present

## 2021-03-11 DIAGNOSIS — Z79899 Other long term (current) drug therapy: Secondary | ICD-10-CM | POA: Diagnosis not present

## 2021-03-16 DIAGNOSIS — G894 Chronic pain syndrome: Secondary | ICD-10-CM | POA: Diagnosis not present

## 2021-03-16 DIAGNOSIS — Z79891 Long term (current) use of opiate analgesic: Secondary | ICD-10-CM | POA: Diagnosis not present

## 2021-04-08 DIAGNOSIS — M4316 Spondylolisthesis, lumbar region: Secondary | ICD-10-CM | POA: Diagnosis not present

## 2021-04-08 DIAGNOSIS — M19011 Primary osteoarthritis, right shoulder: Secondary | ICD-10-CM | POA: Diagnosis not present

## 2021-04-08 DIAGNOSIS — F17200 Nicotine dependence, unspecified, uncomplicated: Secondary | ICD-10-CM | POA: Diagnosis not present

## 2021-04-08 DIAGNOSIS — Z79899 Other long term (current) drug therapy: Secondary | ICD-10-CM | POA: Diagnosis not present

## 2021-04-08 DIAGNOSIS — S4990XA Unspecified injury of shoulder and upper arm, unspecified arm, initial encounter: Secondary | ICD-10-CM | POA: Diagnosis not present

## 2021-04-08 DIAGNOSIS — F17210 Nicotine dependence, cigarettes, uncomplicated: Secondary | ICD-10-CM | POA: Diagnosis not present

## 2021-04-08 DIAGNOSIS — M19012 Primary osteoarthritis, left shoulder: Secondary | ICD-10-CM | POA: Diagnosis not present

## 2021-04-08 DIAGNOSIS — R030 Elevated blood-pressure reading, without diagnosis of hypertension: Secondary | ICD-10-CM | POA: Diagnosis not present

## 2021-04-08 DIAGNOSIS — G894 Chronic pain syndrome: Secondary | ICD-10-CM | POA: Diagnosis not present

## 2021-04-16 DIAGNOSIS — G894 Chronic pain syndrome: Secondary | ICD-10-CM | POA: Diagnosis not present

## 2021-04-16 DIAGNOSIS — Z79891 Long term (current) use of opiate analgesic: Secondary | ICD-10-CM | POA: Diagnosis not present

## 2021-04-28 DIAGNOSIS — M25512 Pain in left shoulder: Secondary | ICD-10-CM | POA: Diagnosis not present

## 2021-05-02 ENCOUNTER — Other Ambulatory Visit: Payer: Self-pay | Admitting: Sports Medicine

## 2021-05-02 ENCOUNTER — Ambulatory Visit
Admission: RE | Admit: 2021-05-02 | Discharge: 2021-05-02 | Disposition: A | Discharge status: Home / Self Care | Payer: Medicare Other | Source: Ambulatory Visit | Attending: Sports Medicine | Admitting: Sports Medicine

## 2021-05-02 DIAGNOSIS — M19012 Primary osteoarthritis, left shoulder: Secondary | ICD-10-CM | POA: Diagnosis not present

## 2021-05-02 DIAGNOSIS — M25512 Pain in left shoulder: Secondary | ICD-10-CM

## 2021-05-12 DIAGNOSIS — M4316 Spondylolisthesis, lumbar region: Secondary | ICD-10-CM | POA: Diagnosis not present

## 2021-05-12 DIAGNOSIS — Z79899 Other long term (current) drug therapy: Secondary | ICD-10-CM | POA: Diagnosis not present

## 2021-05-12 DIAGNOSIS — F17210 Nicotine dependence, cigarettes, uncomplicated: Secondary | ICD-10-CM | POA: Diagnosis not present

## 2021-05-12 DIAGNOSIS — M19011 Primary osteoarthritis, right shoulder: Secondary | ICD-10-CM | POA: Diagnosis not present

## 2021-05-12 DIAGNOSIS — G894 Chronic pain syndrome: Secondary | ICD-10-CM | POA: Diagnosis not present

## 2021-05-12 DIAGNOSIS — M19012 Primary osteoarthritis, left shoulder: Secondary | ICD-10-CM | POA: Diagnosis not present

## 2021-05-12 DIAGNOSIS — R030 Elevated blood-pressure reading, without diagnosis of hypertension: Secondary | ICD-10-CM | POA: Diagnosis not present

## 2021-05-17 DIAGNOSIS — G894 Chronic pain syndrome: Secondary | ICD-10-CM | POA: Diagnosis not present

## 2021-05-17 DIAGNOSIS — Z79891 Long term (current) use of opiate analgesic: Secondary | ICD-10-CM | POA: Diagnosis not present

## 2021-05-18 DIAGNOSIS — Z981 Arthrodesis status: Secondary | ICD-10-CM | POA: Diagnosis not present

## 2021-05-18 DIAGNOSIS — G8929 Other chronic pain: Secondary | ICD-10-CM | POA: Diagnosis not present

## 2021-05-18 DIAGNOSIS — M5440 Lumbago with sciatica, unspecified side: Secondary | ICD-10-CM | POA: Diagnosis not present

## 2021-05-18 DIAGNOSIS — M47816 Spondylosis without myelopathy or radiculopathy, lumbar region: Secondary | ICD-10-CM | POA: Diagnosis not present

## 2021-05-18 DIAGNOSIS — M47812 Spondylosis without myelopathy or radiculopathy, cervical region: Secondary | ICD-10-CM | POA: Diagnosis not present

## 2021-05-18 DIAGNOSIS — M47814 Spondylosis without myelopathy or radiculopathy, thoracic region: Secondary | ICD-10-CM | POA: Diagnosis not present

## 2021-05-18 DIAGNOSIS — M4316 Spondylolisthesis, lumbar region: Secondary | ICD-10-CM | POA: Diagnosis not present

## 2021-05-24 DIAGNOSIS — I10 Essential (primary) hypertension: Secondary | ICD-10-CM | POA: Diagnosis not present

## 2021-05-24 DIAGNOSIS — M4726 Other spondylosis with radiculopathy, lumbar region: Secondary | ICD-10-CM | POA: Diagnosis not present

## 2021-05-24 DIAGNOSIS — F17200 Nicotine dependence, unspecified, uncomplicated: Secondary | ICD-10-CM | POA: Diagnosis not present

## 2021-05-24 DIAGNOSIS — M19041 Primary osteoarthritis, right hand: Secondary | ICD-10-CM | POA: Diagnosis not present

## 2021-05-24 DIAGNOSIS — D509 Iron deficiency anemia, unspecified: Secondary | ICD-10-CM | POA: Diagnosis not present

## 2021-05-24 DIAGNOSIS — E785 Hyperlipidemia, unspecified: Secondary | ICD-10-CM | POA: Diagnosis not present

## 2021-05-25 DIAGNOSIS — Z0130 Encounter for examination of blood pressure without abnormal findings: Secondary | ICD-10-CM | POA: Diagnosis not present

## 2021-05-25 DIAGNOSIS — M4316 Spondylolisthesis, lumbar region: Secondary | ICD-10-CM | POA: Diagnosis not present

## 2021-05-25 DIAGNOSIS — Z79899 Other long term (current) drug therapy: Secondary | ICD-10-CM | POA: Diagnosis not present

## 2021-05-25 DIAGNOSIS — R030 Elevated blood-pressure reading, without diagnosis of hypertension: Secondary | ICD-10-CM | POA: Diagnosis not present

## 2021-06-01 DIAGNOSIS — M5136 Other intervertebral disc degeneration, lumbar region: Secondary | ICD-10-CM | POA: Diagnosis not present

## 2021-06-01 DIAGNOSIS — M4316 Spondylolisthesis, lumbar region: Secondary | ICD-10-CM | POA: Diagnosis not present

## 2021-06-01 DIAGNOSIS — M40205 Unspecified kyphosis, thoracolumbar region: Secondary | ICD-10-CM | POA: Diagnosis not present

## 2021-06-04 DIAGNOSIS — G8929 Other chronic pain: Secondary | ICD-10-CM | POA: Diagnosis not present

## 2021-06-04 DIAGNOSIS — M4046 Postural lordosis, lumbar region: Secondary | ICD-10-CM | POA: Diagnosis not present

## 2021-06-04 DIAGNOSIS — Z87891 Personal history of nicotine dependence: Secondary | ICD-10-CM | POA: Diagnosis not present

## 2021-06-04 DIAGNOSIS — T8182XA Emphysema (subcutaneous) resulting from a procedure, initial encounter: Secondary | ICD-10-CM | POA: Diagnosis not present

## 2021-06-04 DIAGNOSIS — Z96611 Presence of right artificial shoulder joint: Secondary | ICD-10-CM | POA: Diagnosis not present

## 2021-06-04 DIAGNOSIS — D62 Acute posthemorrhagic anemia: Secondary | ICD-10-CM | POA: Diagnosis not present

## 2021-06-04 DIAGNOSIS — Z885 Allergy status to narcotic agent status: Secondary | ICD-10-CM | POA: Diagnosis not present

## 2021-06-04 DIAGNOSIS — M47819 Spondylosis without myelopathy or radiculopathy, site unspecified: Secondary | ICD-10-CM | POA: Diagnosis not present

## 2021-06-04 DIAGNOSIS — M4056 Lordosis, unspecified, lumbar region: Secondary | ICD-10-CM | POA: Diagnosis not present

## 2021-06-04 DIAGNOSIS — M19012 Primary osteoarthritis, left shoulder: Secondary | ICD-10-CM | POA: Diagnosis not present

## 2021-06-04 DIAGNOSIS — Z79899 Other long term (current) drug therapy: Secondary | ICD-10-CM | POA: Diagnosis not present

## 2021-06-04 DIAGNOSIS — M1990 Unspecified osteoarthritis, unspecified site: Secondary | ICD-10-CM | POA: Diagnosis not present

## 2021-06-04 DIAGNOSIS — Z981 Arthrodesis status: Secondary | ICD-10-CM | POA: Diagnosis not present

## 2021-06-04 DIAGNOSIS — M4036 Flatback syndrome, lumbar region: Secondary | ICD-10-CM | POA: Diagnosis not present

## 2021-06-04 DIAGNOSIS — Z20822 Contact with and (suspected) exposure to covid-19: Secondary | ICD-10-CM | POA: Diagnosis not present

## 2021-06-04 DIAGNOSIS — Z888 Allergy status to other drugs, medicaments and biological substances status: Secondary | ICD-10-CM | POA: Diagnosis not present

## 2021-06-04 DIAGNOSIS — M4316 Spondylolisthesis, lumbar region: Secondary | ICD-10-CM | POA: Diagnosis not present

## 2021-06-04 DIAGNOSIS — M4030 Flatback syndrome, site unspecified: Secondary | ICD-10-CM | POA: Diagnosis not present

## 2021-06-05 DIAGNOSIS — Z79899 Other long term (current) drug therapy: Secondary | ICD-10-CM | POA: Diagnosis not present

## 2021-06-05 DIAGNOSIS — M4316 Spondylolisthesis, lumbar region: Secondary | ICD-10-CM | POA: Diagnosis not present

## 2021-06-07 DIAGNOSIS — M4036 Flatback syndrome, lumbar region: Secondary | ICD-10-CM | POA: Diagnosis not present

## 2021-06-07 DIAGNOSIS — Z981 Arthrodesis status: Secondary | ICD-10-CM | POA: Diagnosis not present

## 2021-06-07 DIAGNOSIS — M4316 Spondylolisthesis, lumbar region: Secondary | ICD-10-CM | POA: Diagnosis not present

## 2021-06-07 DIAGNOSIS — M4056 Lordosis, unspecified, lumbar region: Secondary | ICD-10-CM | POA: Diagnosis not present

## 2021-06-09 DIAGNOSIS — M19012 Primary osteoarthritis, left shoulder: Secondary | ICD-10-CM | POA: Diagnosis not present

## 2021-06-09 DIAGNOSIS — Z96611 Presence of right artificial shoulder joint: Secondary | ICD-10-CM | POA: Diagnosis not present

## 2021-06-09 DIAGNOSIS — T8182XA Emphysema (subcutaneous) resulting from a procedure, initial encounter: Secondary | ICD-10-CM | POA: Diagnosis not present

## 2021-06-09 DIAGNOSIS — Z981 Arthrodesis status: Secondary | ICD-10-CM | POA: Diagnosis not present

## 2021-06-09 DIAGNOSIS — M47819 Spondylosis without myelopathy or radiculopathy, site unspecified: Secondary | ICD-10-CM | POA: Diagnosis not present

## 2021-06-09 DIAGNOSIS — M4030 Flatback syndrome, site unspecified: Secondary | ICD-10-CM | POA: Diagnosis not present

## 2021-06-11 DIAGNOSIS — M4316 Spondylolisthesis, lumbar region: Secondary | ICD-10-CM | POA: Diagnosis not present

## 2021-06-11 DIAGNOSIS — M4030 Flatback syndrome, site unspecified: Secondary | ICD-10-CM | POA: Diagnosis not present

## 2021-06-13 DIAGNOSIS — M19012 Primary osteoarthritis, left shoulder: Secondary | ICD-10-CM | POA: Diagnosis not present

## 2021-06-13 DIAGNOSIS — Z79899 Other long term (current) drug therapy: Secondary | ICD-10-CM | POA: Diagnosis not present

## 2021-06-13 DIAGNOSIS — G894 Chronic pain syndrome: Secondary | ICD-10-CM | POA: Diagnosis not present

## 2021-06-13 DIAGNOSIS — M19011 Primary osteoarthritis, right shoulder: Secondary | ICD-10-CM | POA: Diagnosis not present

## 2021-06-13 DIAGNOSIS — M4316 Spondylolisthesis, lumbar region: Secondary | ICD-10-CM | POA: Diagnosis not present

## 2021-06-14 DIAGNOSIS — Z79891 Long term (current) use of opiate analgesic: Secondary | ICD-10-CM | POA: Diagnosis not present

## 2021-06-14 DIAGNOSIS — G894 Chronic pain syndrome: Secondary | ICD-10-CM | POA: Diagnosis not present

## 2021-06-21 DIAGNOSIS — M549 Dorsalgia, unspecified: Secondary | ICD-10-CM | POA: Diagnosis not present

## 2021-06-21 DIAGNOSIS — G8929 Other chronic pain: Secondary | ICD-10-CM | POA: Diagnosis not present

## 2021-07-05 DIAGNOSIS — M25512 Pain in left shoulder: Secondary | ICD-10-CM | POA: Diagnosis not present

## 2021-07-05 DIAGNOSIS — M25511 Pain in right shoulder: Secondary | ICD-10-CM | POA: Diagnosis not present

## 2021-07-05 DIAGNOSIS — M79644 Pain in right finger(s): Secondary | ICD-10-CM | POA: Diagnosis not present

## 2021-07-09 DIAGNOSIS — E7800 Pure hypercholesterolemia, unspecified: Secondary | ICD-10-CM | POA: Diagnosis not present

## 2021-07-09 DIAGNOSIS — M19011 Primary osteoarthritis, right shoulder: Secondary | ICD-10-CM | POA: Diagnosis not present

## 2021-07-09 DIAGNOSIS — M4316 Spondylolisthesis, lumbar region: Secondary | ICD-10-CM | POA: Diagnosis not present

## 2021-07-09 DIAGNOSIS — M19012 Primary osteoarthritis, left shoulder: Secondary | ICD-10-CM | POA: Diagnosis not present

## 2021-07-09 DIAGNOSIS — G894 Chronic pain syndrome: Secondary | ICD-10-CM | POA: Diagnosis not present

## 2021-07-09 DIAGNOSIS — Z6824 Body mass index (BMI) 24.0-24.9, adult: Secondary | ICD-10-CM | POA: Diagnosis not present

## 2021-07-09 DIAGNOSIS — E559 Vitamin D deficiency, unspecified: Secondary | ICD-10-CM | POA: Diagnosis not present

## 2021-07-09 DIAGNOSIS — Z79899 Other long term (current) drug therapy: Secondary | ICD-10-CM | POA: Diagnosis not present

## 2021-07-15 DIAGNOSIS — G894 Chronic pain syndrome: Secondary | ICD-10-CM | POA: Diagnosis not present

## 2021-07-15 DIAGNOSIS — Z79891 Long term (current) use of opiate analgesic: Secondary | ICD-10-CM | POA: Diagnosis not present

## 2021-08-05 DIAGNOSIS — M25511 Pain in right shoulder: Secondary | ICD-10-CM | POA: Diagnosis not present

## 2021-08-05 DIAGNOSIS — M25512 Pain in left shoulder: Secondary | ICD-10-CM | POA: Diagnosis not present

## 2021-08-05 DIAGNOSIS — M65311 Trigger thumb, right thumb: Secondary | ICD-10-CM | POA: Diagnosis not present

## 2021-08-06 DIAGNOSIS — Z79899 Other long term (current) drug therapy: Secondary | ICD-10-CM | POA: Diagnosis not present

## 2021-08-06 DIAGNOSIS — M4316 Spondylolisthesis, lumbar region: Secondary | ICD-10-CM | POA: Diagnosis not present

## 2021-08-06 DIAGNOSIS — F17210 Nicotine dependence, cigarettes, uncomplicated: Secondary | ICD-10-CM | POA: Diagnosis not present

## 2021-08-06 DIAGNOSIS — M19011 Primary osteoarthritis, right shoulder: Secondary | ICD-10-CM | POA: Diagnosis not present

## 2021-08-06 DIAGNOSIS — Z6824 Body mass index (BMI) 24.0-24.9, adult: Secondary | ICD-10-CM | POA: Diagnosis not present

## 2021-08-06 DIAGNOSIS — M62838 Other muscle spasm: Secondary | ICD-10-CM | POA: Diagnosis not present

## 2021-08-06 DIAGNOSIS — G894 Chronic pain syndrome: Secondary | ICD-10-CM | POA: Diagnosis not present

## 2021-08-10 DIAGNOSIS — M47812 Spondylosis without myelopathy or radiculopathy, cervical region: Secondary | ICD-10-CM | POA: Diagnosis not present

## 2021-08-10 DIAGNOSIS — M1712 Unilateral primary osteoarthritis, left knee: Secondary | ICD-10-CM | POA: Diagnosis not present

## 2021-08-10 DIAGNOSIS — Z981 Arthrodesis status: Secondary | ICD-10-CM | POA: Diagnosis not present

## 2021-08-10 DIAGNOSIS — M47816 Spondylosis without myelopathy or radiculopathy, lumbar region: Secondary | ICD-10-CM | POA: Diagnosis not present

## 2021-08-10 DIAGNOSIS — M47814 Spondylosis without myelopathy or radiculopathy, thoracic region: Secondary | ICD-10-CM | POA: Diagnosis not present

## 2021-08-16 DIAGNOSIS — M25511 Pain in right shoulder: Secondary | ICD-10-CM | POA: Diagnosis not present

## 2021-09-15 DIAGNOSIS — F17210 Nicotine dependence, cigarettes, uncomplicated: Secondary | ICD-10-CM | POA: Diagnosis not present

## 2021-09-15 DIAGNOSIS — Z79899 Other long term (current) drug therapy: Secondary | ICD-10-CM | POA: Diagnosis not present

## 2021-09-15 DIAGNOSIS — G894 Chronic pain syndrome: Secondary | ICD-10-CM | POA: Diagnosis not present

## 2021-09-15 DIAGNOSIS — M19011 Primary osteoarthritis, right shoulder: Secondary | ICD-10-CM | POA: Diagnosis not present

## 2021-09-15 DIAGNOSIS — Z6824 Body mass index (BMI) 24.0-24.9, adult: Secondary | ICD-10-CM | POA: Diagnosis not present

## 2021-09-15 DIAGNOSIS — M62838 Other muscle spasm: Secondary | ICD-10-CM | POA: Diagnosis not present

## 2021-09-15 DIAGNOSIS — M4316 Spondylolisthesis, lumbar region: Secondary | ICD-10-CM | POA: Diagnosis not present

## 2021-09-28 DIAGNOSIS — Z981 Arthrodesis status: Secondary | ICD-10-CM | POA: Diagnosis not present

## 2021-10-11 DIAGNOSIS — M4316 Spondylolisthesis, lumbar region: Secondary | ICD-10-CM | POA: Diagnosis not present

## 2021-10-11 DIAGNOSIS — Z6824 Body mass index (BMI) 24.0-24.9, adult: Secondary | ICD-10-CM | POA: Diagnosis not present

## 2021-10-11 DIAGNOSIS — Z79899 Other long term (current) drug therapy: Secondary | ICD-10-CM | POA: Diagnosis not present

## 2021-10-11 DIAGNOSIS — R030 Elevated blood-pressure reading, without diagnosis of hypertension: Secondary | ICD-10-CM | POA: Diagnosis not present

## 2021-10-11 DIAGNOSIS — F17210 Nicotine dependence, cigarettes, uncomplicated: Secondary | ICD-10-CM | POA: Diagnosis not present

## 2021-10-11 DIAGNOSIS — M19011 Primary osteoarthritis, right shoulder: Secondary | ICD-10-CM | POA: Diagnosis not present

## 2021-10-11 DIAGNOSIS — G894 Chronic pain syndrome: Secondary | ICD-10-CM | POA: Diagnosis not present

## 2022-01-06 DIAGNOSIS — F32A Depression, unspecified: Secondary | ICD-10-CM | POA: Diagnosis not present

## 2022-01-06 DIAGNOSIS — G5603 Carpal tunnel syndrome, bilateral upper limbs: Secondary | ICD-10-CM | POA: Diagnosis not present

## 2022-01-06 DIAGNOSIS — G894 Chronic pain syndrome: Secondary | ICD-10-CM | POA: Diagnosis not present

## 2022-01-06 DIAGNOSIS — M19012 Primary osteoarthritis, left shoulder: Secondary | ICD-10-CM | POA: Diagnosis not present

## 2022-01-06 DIAGNOSIS — E611 Iron deficiency: Secondary | ICD-10-CM | POA: Diagnosis not present

## 2022-01-06 DIAGNOSIS — M25511 Pain in right shoulder: Secondary | ICD-10-CM | POA: Diagnosis not present

## 2022-01-06 DIAGNOSIS — E559 Vitamin D deficiency, unspecified: Secondary | ICD-10-CM | POA: Diagnosis not present

## 2022-01-06 DIAGNOSIS — Z6824 Body mass index (BMI) 24.0-24.9, adult: Secondary | ICD-10-CM | POA: Diagnosis not present

## 2022-01-06 DIAGNOSIS — M19011 Primary osteoarthritis, right shoulder: Secondary | ICD-10-CM | POA: Diagnosis not present

## 2022-01-06 DIAGNOSIS — Z0130 Encounter for examination of blood pressure without abnormal findings: Secondary | ICD-10-CM | POA: Diagnosis not present

## 2022-02-04 DIAGNOSIS — M19011 Primary osteoarthritis, right shoulder: Secondary | ICD-10-CM | POA: Diagnosis not present

## 2022-02-04 DIAGNOSIS — M19012 Primary osteoarthritis, left shoulder: Secondary | ICD-10-CM | POA: Diagnosis not present

## 2022-02-04 DIAGNOSIS — Z0130 Encounter for examination of blood pressure without abnormal findings: Secondary | ICD-10-CM | POA: Diagnosis not present

## 2022-02-04 DIAGNOSIS — F32A Depression, unspecified: Secondary | ICD-10-CM | POA: Diagnosis not present

## 2022-02-04 DIAGNOSIS — E559 Vitamin D deficiency, unspecified: Secondary | ICD-10-CM | POA: Diagnosis not present

## 2022-02-04 DIAGNOSIS — G894 Chronic pain syndrome: Secondary | ICD-10-CM | POA: Diagnosis not present

## 2022-02-04 DIAGNOSIS — E611 Iron deficiency: Secondary | ICD-10-CM | POA: Diagnosis not present

## 2022-02-04 DIAGNOSIS — Z6824 Body mass index (BMI) 24.0-24.9, adult: Secondary | ICD-10-CM | POA: Diagnosis not present

## 2022-02-04 DIAGNOSIS — K5900 Constipation, unspecified: Secondary | ICD-10-CM | POA: Diagnosis not present

## 2022-02-04 DIAGNOSIS — G5603 Carpal tunnel syndrome, bilateral upper limbs: Secondary | ICD-10-CM | POA: Diagnosis not present

## 2022-03-07 DIAGNOSIS — R030 Elevated blood-pressure reading, without diagnosis of hypertension: Secondary | ICD-10-CM | POA: Diagnosis not present

## 2022-03-07 DIAGNOSIS — M62830 Muscle spasm of back: Secondary | ICD-10-CM | POA: Diagnosis not present

## 2022-03-07 DIAGNOSIS — M4316 Spondylolisthesis, lumbar region: Secondary | ICD-10-CM | POA: Diagnosis not present

## 2022-03-07 DIAGNOSIS — M25511 Pain in right shoulder: Secondary | ICD-10-CM | POA: Diagnosis not present

## 2022-03-07 DIAGNOSIS — M19011 Primary osteoarthritis, right shoulder: Secondary | ICD-10-CM | POA: Diagnosis not present

## 2022-03-07 DIAGNOSIS — M25512 Pain in left shoulder: Secondary | ICD-10-CM | POA: Diagnosis not present

## 2022-03-07 DIAGNOSIS — M19012 Primary osteoarthritis, left shoulder: Secondary | ICD-10-CM | POA: Diagnosis not present

## 2022-03-07 DIAGNOSIS — G5603 Carpal tunnel syndrome, bilateral upper limbs: Secondary | ICD-10-CM | POA: Diagnosis not present

## 2022-03-07 DIAGNOSIS — G8929 Other chronic pain: Secondary | ICD-10-CM | POA: Diagnosis not present

## 2022-03-07 DIAGNOSIS — Z79899 Other long term (current) drug therapy: Secondary | ICD-10-CM | POA: Diagnosis not present

## 2022-03-07 DIAGNOSIS — G894 Chronic pain syndrome: Secondary | ICD-10-CM | POA: Diagnosis not present

## 2022-03-07 DIAGNOSIS — M549 Dorsalgia, unspecified: Secondary | ICD-10-CM | POA: Diagnosis not present

## 2022-03-09 DIAGNOSIS — Z79899 Other long term (current) drug therapy: Secondary | ICD-10-CM | POA: Diagnosis not present

## 2022-03-12 IMAGING — MR MR LUMBAR SPINE W/O CM
4 of 5 series · 24 of 48 positions shown · non-contrast
Comparison: Radiography 01/16/2020.  MRI 03/22/2019.

CLINICAL DATA: Low back pain and right buttock and leg pain.
Weakness.

EXAM:
MRI LUMBAR SPINE WITHOUT CONTRAST
TECHNIQUE: Multiplanar, multisequence MR imaging of the lumbar spine was
performed. No intravenous contrast was administered.

[Series 3: T2 · sagittal · 4.0mm · 0.53mm/px · 6 of 17 slices shown (1 of 2)]
[im 1/17]
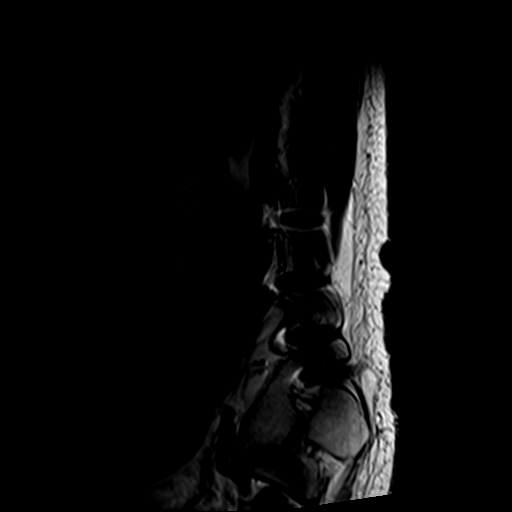
[im 4/17]
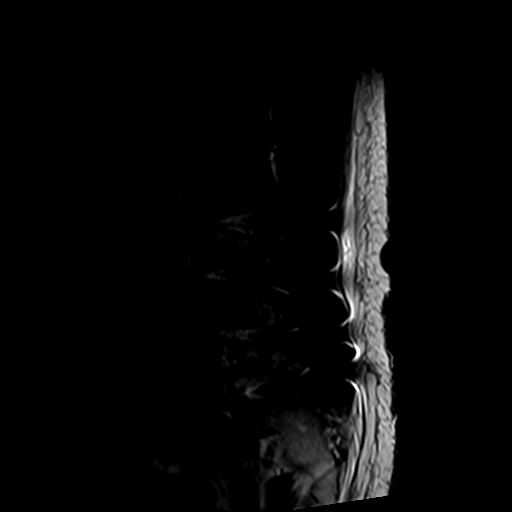
[im 7/17]
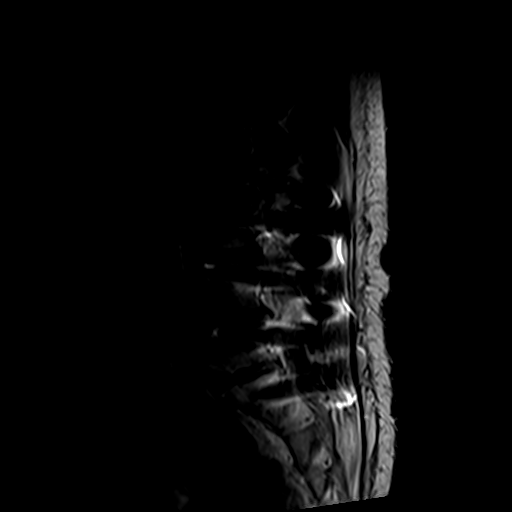
[im 10/17]
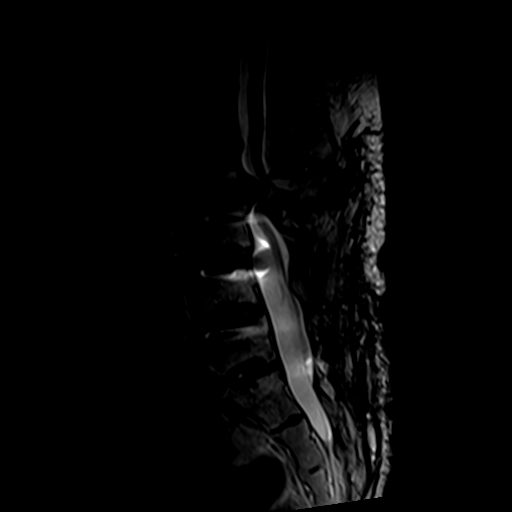
[im 13/17]
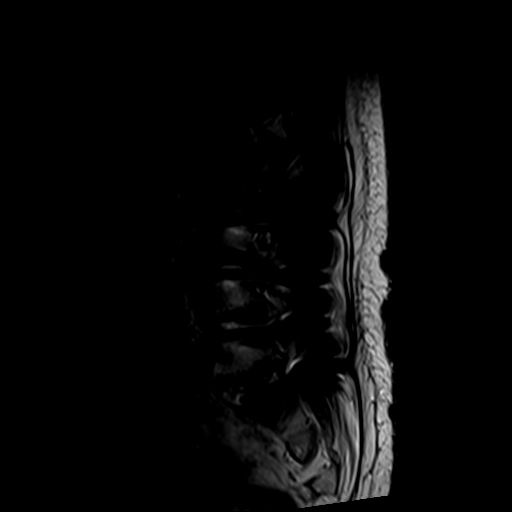
[im 17/17]
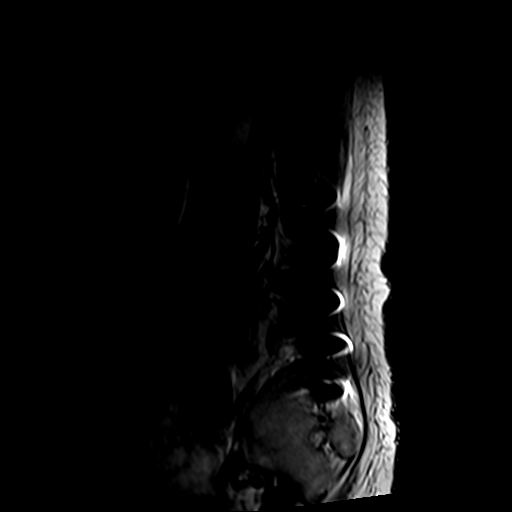

[Series 5: T1 · sagittal · 4.0mm · 0.53mm/px · 7 of 17 slices shown (1 of 2)]
[im 1/17]
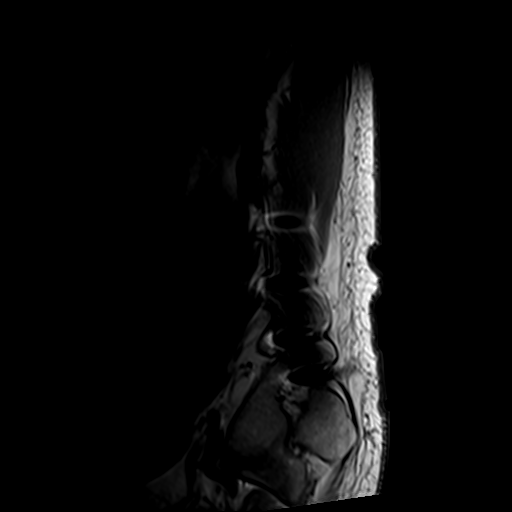
[im 3/17]
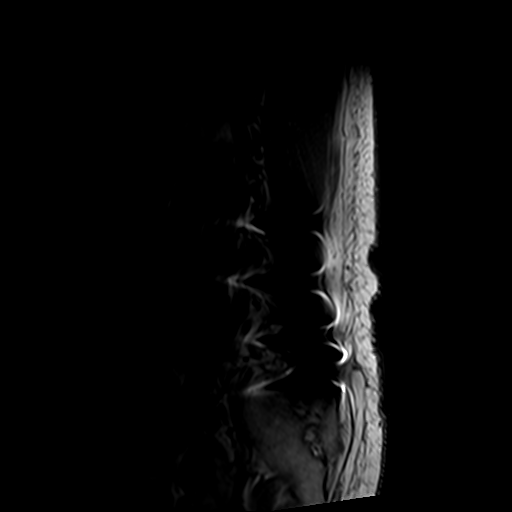
[im 6/17]
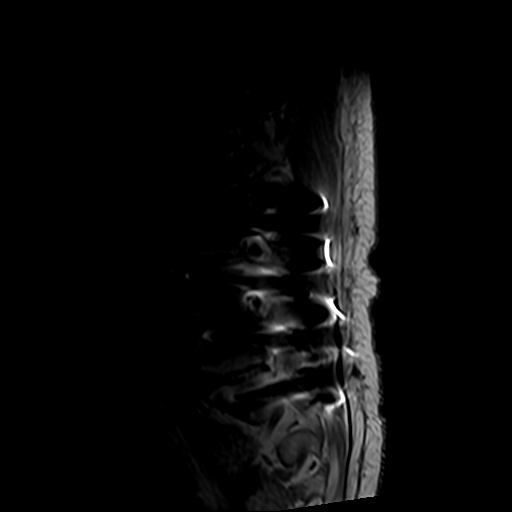
[im 9/17]
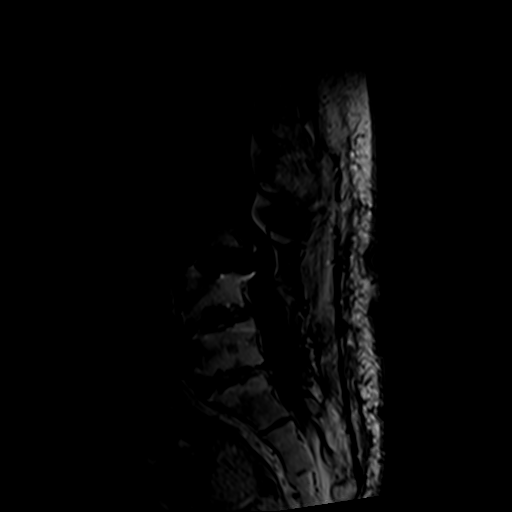
[im 11/17]
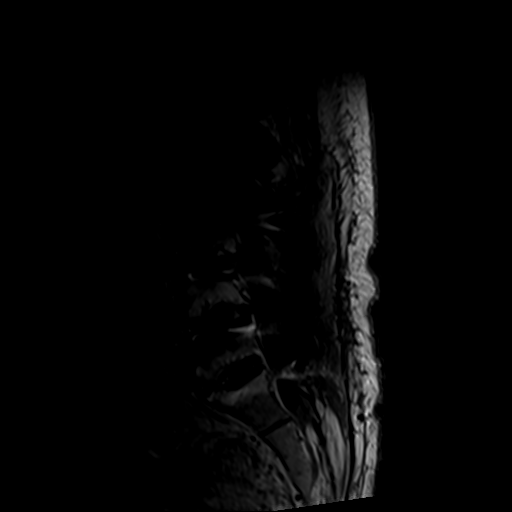
[im 14/17]
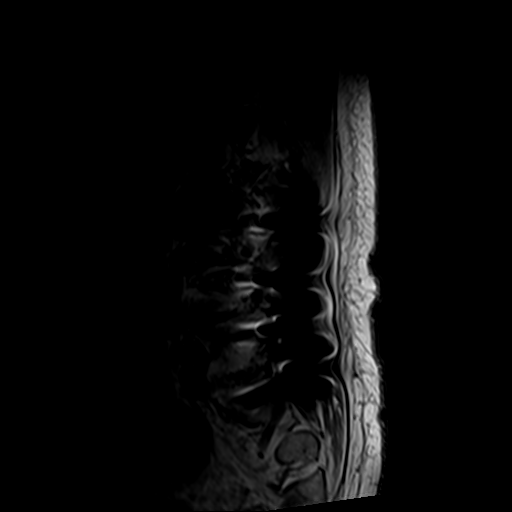
[im 17/17]
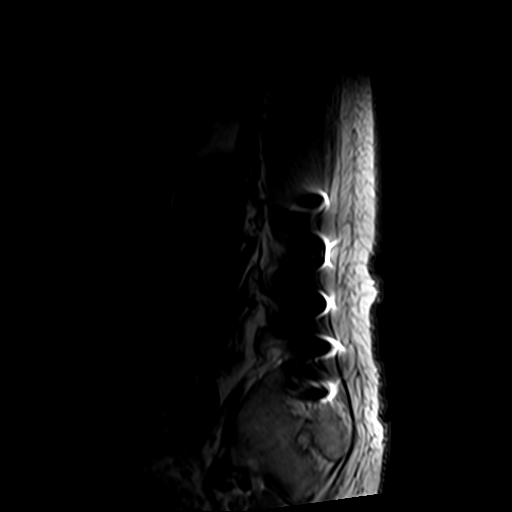

[Series 6: T2 · axial · 4.0mm · 0.70mm/px · z∈[-128,+59]mm · 8 of 37 slices shown (2 of 2)]
[im 1/37]
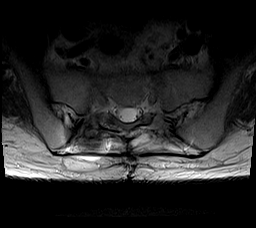
[im 6/37]
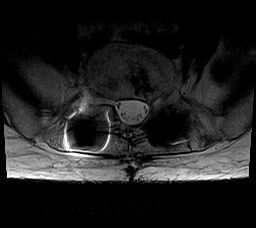
[im 12/37]
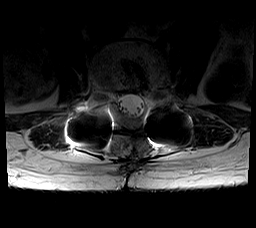
[im 17/37]
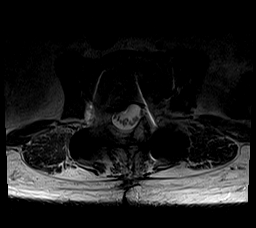
[im 20/37]
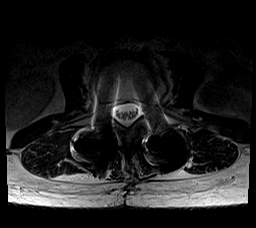
[im 25/37]
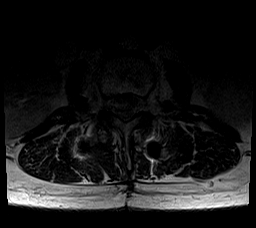
[im 31/37]
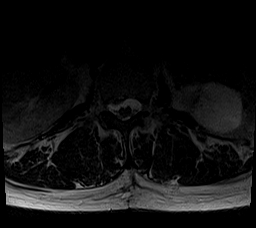
[im 37/37]
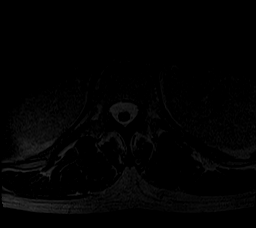

[Series 7: T1 · axial · 4.0mm · 0.35mm/px · z∈[-102,+28]mm · 3 of 37 slices shown (2 of 2)]
[im 6/37]
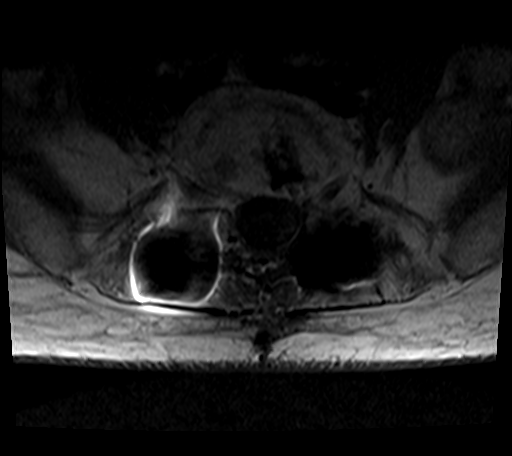
[im 20/37]
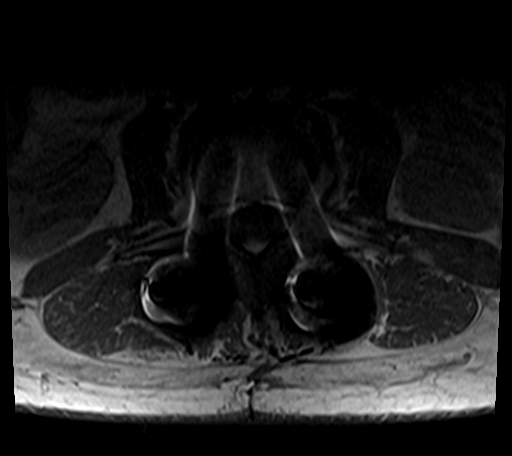
[im 31/37]
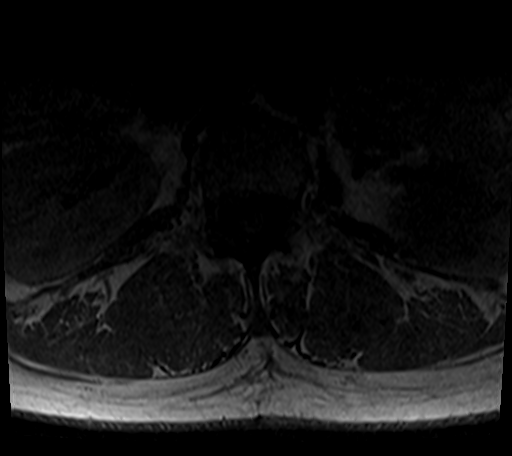

[24 of 48 positions shown; findings below may reference images not displayed]

FINDINGS: Segmentation: 5 lumbar type vertebral bodies as numbered previously.

Alignment:  4 mm degenerative anterolisthesis at L2-3.

Vertebrae: Newly seen inferior endplate fracture at L2 with
associated marrow edema. This was not visible on the radiography of
last [REDACTED] or the prior MRI.

Previous decompression, diskectomy and fusion procedure from L3 to
the sacrum as seen previously.

Conus medullaris and cauda equina: Conus extends to the L2 level.
Conus and cauda equina appear normal.

Paraspinal and other soft tissues: Negative

Disc levels:

T12-L1: Normal

L1-2: Right posterolateral disc herniation with upward migration
behind L1 to the right of midline. This narrows the right lateral
recess and could cause right-sided neural compression. No
compression of the actual distal cord/conus. This was present
previously but may be slightly more prominent.

L2-3: Bilateral facet arthropathy with 4 mm of anterolisthesis.
Broad-based disc herniation. Severe spinal stenosis at this level
that could cause neural compression on either or both sides. This
has worsened since the study March 2019.

L3 to sacrum: Previous discectomy, posterior decompression and
fusion. Sufficient patency of the canal and foramina as seen
previously.
IMPRESSION: Satisfactory appearance in the decompression, diskectomy and fusion
segment from L3 to the sacrum. No change.

L2-3: Facet arthropathy with worsened anterolisthesis, now measuring
4 mm. Broad-based disc herniation which is larger. Severe spinal
stenosis which is worsened. Newly seen inferior endplate fracture at
L2 centrally with marrow edema.

L1-2: Slight enlargement of a right posterolateral disc herniation
with upward migration to the right of midline behind L1. Narrowing
of the right lateral recess could cause neural compression.

## 2022-04-06 DIAGNOSIS — M4316 Spondylolisthesis, lumbar region: Secondary | ICD-10-CM | POA: Diagnosis not present

## 2022-04-06 DIAGNOSIS — M961 Postlaminectomy syndrome, not elsewhere classified: Secondary | ICD-10-CM | POA: Diagnosis not present

## 2022-04-06 DIAGNOSIS — M62830 Muscle spasm of back: Secondary | ICD-10-CM | POA: Diagnosis not present

## 2022-04-06 DIAGNOSIS — G894 Chronic pain syndrome: Secondary | ICD-10-CM | POA: Diagnosis not present

## 2022-04-06 DIAGNOSIS — M19012 Primary osteoarthritis, left shoulder: Secondary | ICD-10-CM | POA: Diagnosis not present

## 2022-04-06 DIAGNOSIS — M25511 Pain in right shoulder: Secondary | ICD-10-CM | POA: Diagnosis not present

## 2022-04-06 DIAGNOSIS — M25512 Pain in left shoulder: Secondary | ICD-10-CM | POA: Diagnosis not present

## 2022-04-06 DIAGNOSIS — G8929 Other chronic pain: Secondary | ICD-10-CM | POA: Diagnosis not present

## 2022-04-06 DIAGNOSIS — R030 Elevated blood-pressure reading, without diagnosis of hypertension: Secondary | ICD-10-CM | POA: Diagnosis not present

## 2022-04-06 DIAGNOSIS — M549 Dorsalgia, unspecified: Secondary | ICD-10-CM | POA: Diagnosis not present

## 2022-04-06 DIAGNOSIS — G5603 Carpal tunnel syndrome, bilateral upper limbs: Secondary | ICD-10-CM | POA: Diagnosis not present

## 2022-04-06 DIAGNOSIS — M19011 Primary osteoarthritis, right shoulder: Secondary | ICD-10-CM | POA: Diagnosis not present

## 2022-04-06 DIAGNOSIS — Z79899 Other long term (current) drug therapy: Secondary | ICD-10-CM | POA: Diagnosis not present

## 2022-04-11 DIAGNOSIS — Z6824 Body mass index (BMI) 24.0-24.9, adult: Secondary | ICD-10-CM | POA: Diagnosis not present

## 2022-04-11 DIAGNOSIS — G894 Chronic pain syndrome: Secondary | ICD-10-CM | POA: Diagnosis not present

## 2022-04-11 DIAGNOSIS — M25512 Pain in left shoulder: Secondary | ICD-10-CM | POA: Diagnosis not present

## 2022-04-11 DIAGNOSIS — M25511 Pain in right shoulder: Secondary | ICD-10-CM | POA: Diagnosis not present

## 2022-04-11 DIAGNOSIS — G5603 Carpal tunnel syndrome, bilateral upper limbs: Secondary | ICD-10-CM | POA: Diagnosis not present

## 2022-04-11 DIAGNOSIS — Z0130 Encounter for examination of blood pressure without abnormal findings: Secondary | ICD-10-CM | POA: Diagnosis not present

## 2022-04-11 DIAGNOSIS — M4316 Spondylolisthesis, lumbar region: Secondary | ICD-10-CM | POA: Diagnosis not present

## 2022-04-11 DIAGNOSIS — M19012 Primary osteoarthritis, left shoulder: Secondary | ICD-10-CM | POA: Diagnosis not present

## 2022-04-11 DIAGNOSIS — K5900 Constipation, unspecified: Secondary | ICD-10-CM | POA: Diagnosis not present

## 2022-04-11 DIAGNOSIS — G8929 Other chronic pain: Secondary | ICD-10-CM | POA: Diagnosis not present

## 2022-04-11 DIAGNOSIS — M549 Dorsalgia, unspecified: Secondary | ICD-10-CM | POA: Diagnosis not present

## 2022-04-11 DIAGNOSIS — M19011 Primary osteoarthritis, right shoulder: Secondary | ICD-10-CM | POA: Diagnosis not present

## 2022-04-12 DIAGNOSIS — Z79899 Other long term (current) drug therapy: Secondary | ICD-10-CM | POA: Diagnosis not present

## 2022-04-20 DIAGNOSIS — R490 Dysphonia: Secondary | ICD-10-CM | POA: Diagnosis not present

## 2022-04-20 DIAGNOSIS — R1313 Dysphagia, pharyngeal phase: Secondary | ICD-10-CM | POA: Diagnosis not present

## 2022-05-12 DIAGNOSIS — Z79899 Other long term (current) drug therapy: Secondary | ICD-10-CM | POA: Diagnosis not present

## 2022-05-12 DIAGNOSIS — Z0130 Encounter for examination of blood pressure without abnormal findings: Secondary | ICD-10-CM | POA: Diagnosis not present

## 2022-05-12 DIAGNOSIS — Z0000 Encounter for general adult medical examination without abnormal findings: Secondary | ICD-10-CM | POA: Diagnosis not present

## 2022-05-29 ENCOUNTER — Ambulatory Visit: Payer: Medicare Other | Admitting: Advanced Practice Midwife

## 2022-06-06 DIAGNOSIS — Z0000 Encounter for general adult medical examination without abnormal findings: Secondary | ICD-10-CM | POA: Diagnosis not present

## 2022-06-06 DIAGNOSIS — E785 Hyperlipidemia, unspecified: Secondary | ICD-10-CM | POA: Diagnosis not present

## 2022-06-06 DIAGNOSIS — E559 Vitamin D deficiency, unspecified: Secondary | ICD-10-CM | POA: Diagnosis not present

## 2022-06-06 DIAGNOSIS — Z122 Encounter for screening for malignant neoplasm of respiratory organs: Secondary | ICD-10-CM | POA: Diagnosis not present

## 2022-06-06 DIAGNOSIS — M25511 Pain in right shoulder: Secondary | ICD-10-CM | POA: Diagnosis not present

## 2022-06-06 DIAGNOSIS — D509 Iron deficiency anemia, unspecified: Secondary | ICD-10-CM | POA: Diagnosis not present

## 2022-06-06 DIAGNOSIS — M25512 Pain in left shoulder: Secondary | ICD-10-CM | POA: Diagnosis not present

## 2022-06-06 DIAGNOSIS — Z23 Encounter for immunization: Secondary | ICD-10-CM | POA: Diagnosis not present

## 2022-06-06 DIAGNOSIS — B351 Tinea unguium: Secondary | ICD-10-CM | POA: Diagnosis not present

## 2022-06-07 ENCOUNTER — Other Ambulatory Visit: Payer: Self-pay | Admitting: Family Medicine

## 2022-06-07 DIAGNOSIS — Z1231 Encounter for screening mammogram for malignant neoplasm of breast: Secondary | ICD-10-CM

## 2022-06-07 DIAGNOSIS — Z122 Encounter for screening for malignant neoplasm of respiratory organs: Secondary | ICD-10-CM

## 2022-06-14 ENCOUNTER — Other Ambulatory Visit: Payer: Self-pay | Admitting: Family

## 2022-06-14 DIAGNOSIS — Z13820 Encounter for screening for osteoporosis: Secondary | ICD-10-CM

## 2022-07-06 ENCOUNTER — Inpatient Hospital Stay: Admission: RE | Admit: 2022-07-06 | Payer: Medicare Other | Source: Ambulatory Visit

## 2022-07-07 ENCOUNTER — Ambulatory Visit
Admission: RE | Admit: 2022-07-07 | Discharge: 2022-07-07 | Disposition: A | Discharge status: Home / Self Care | Payer: Medicare Other | Source: Ambulatory Visit | Attending: Family Medicine | Admitting: Family Medicine

## 2022-07-07 DIAGNOSIS — Z1231 Encounter for screening mammogram for malignant neoplasm of breast: Secondary | ICD-10-CM

## 2022-07-11 ENCOUNTER — Other Ambulatory Visit: Payer: Medicare Other

## 2022-08-25 ENCOUNTER — Other Ambulatory Visit: Payer: Self-pay | Admitting: Orthopaedic Surgery

## 2022-08-25 DIAGNOSIS — Z01818 Encounter for other preprocedural examination: Secondary | ICD-10-CM

## 2022-09-06 ENCOUNTER — Encounter (HOSPITAL_COMMUNITY): Payer: Self-pay

## 2022-09-06 ENCOUNTER — Other Ambulatory Visit: Payer: Medicare Other

## 2022-09-07 ENCOUNTER — Ambulatory Visit
Admission: RE | Admit: 2022-09-07 | Discharge: 2022-09-07 | Disposition: A | Discharge status: Home / Self Care | Payer: Medicare Other | Source: Ambulatory Visit | Attending: Orthopaedic Surgery | Admitting: Orthopaedic Surgery

## 2022-09-07 DIAGNOSIS — Z01818 Encounter for other preprocedural examination: Secondary | ICD-10-CM

## 2022-09-21 NOTE — Patient Instructions (Addendum)
SURGICAL WAITING ROOM VISITATION  Patients having surgery or a procedure may have no more than 2 support people in the waiting area - these visitors may rotate.    Children under the age of 3 must have an adult with them who is not the patient.  Due to an increase in RSV and influenza rates and associated hospitalizations, children ages 35 and under may not visit patients in [facility] hospitals.  If the patient needs to stay at the hospital during part of their recovery, the visitor guidelines for inpatient rooms apply. Pre-op nurse will coordinate an appropriate time for 1 support person to accompany patient in pre-op.  This support person may not rotate.    Please refer to the [facility] website for the visitor guidelines for Inpatients (after your surgery is over and you are in a regular room).       Your procedure is scheduled on: 10/04/22   Report to [facility] Main Entrance    Report to admitting at  9:25 AM   Call this number if you have problems the morning of surgery [phone]   Do not eat food or drink liquids:After Midnight. )                                     Oral Hygiene is also important to reduce your risk of infection.                                    Remember - BRUSH YOUR TEETH THE MORNING OF SURGERY WITH YOUR REGULAR TOOTHPASTE   Take these medicines the morning of surgery with A SIP OF WATER: Tylenol, Omeprazole(Prilosec)             You may not have any metal on your body including hair pins, jewelry, and body piercing             Do not wear make-up, lotions, powders, perfumes, or deodorant  Do not wear nail polish including gel and S&S, artificial/acrylic nails, or any other type of covering on natural nails including finger and toenails. If you have artificial nails, gel coating, etc. that needs to be removed by a nail salon please have this removed prior to surgery or surgery may need to be canceled/ delayed if the surgeon/ anesthesia  feels like they are unable to be safely monitored.   Do not shave  48 hours prior to surgery.     Do not bring valuables to the hospital. [place] IS NOT             RESPONSIBLE   FOR VALUABLES.   Contacts, glasses, dentures or bridgework may not be worn into surgery.   Bring small overnight bag day of surgery.   DO NOT BRING YOUR HOME MEDICATIONS TO THE HOSPITAL. PHARMACY WILL DISPENSE MEDICATIONS LISTED ON YOUR MEDICATION LIST TO YOU DURING YOUR ADMISSION IN THE HOSPITAL!    Patients discharged on the day of surgery will not be allowed to drive home.  Someone NEEDS to stay with you for the first 24 hours after anesthesia.   Special Instructions: Bring a copy of your healthcare power of attorney and living will documents the day of surgery if you haven't scanned them before.              Please read over  the following fact sheets you were given: IF YOU HAVE QUESTIONS ABOUT YOUR PRE-OP INSTRUCTIONS PLEASE CALL [phone]   If you received a COVID test during your pre-op visit  it is requested that you wear a mask when out in public, stay away from anyone that may not be feeling well and notify your surgeon if you develop symptoms. If you test positive for Covid or have been in contact with anyone that has tested positive in the last 10 days please notify you surgeon.    Pre-operative 5 CHG Bath Instructions   You can play a key role in reducing the risk of infection after surgery. Your skin needs to be as free of germs as possible. You can reduce the number of germs on your skin by washing with CHG (chlorhexidine gluconate) soap before surgery. CHG is an antiseptic soap that kills germs and continues to kill germs even after washing.   DO NOT use if you have an allergy to chlorhexidine/CHG or antibacterial soaps. If your skin becomes reddened or irritated, stop using the CHG and notify one of our RNs at [phone].   Please shower with the CHG soap starting 4 days before  surgery using the following schedule:     Please keep in mind the following:  DO NOT shave, including legs and underarms, starting the day of your first shower.   You may shave your face at any point before/day of surgery.  Place clean sheets on your bed the day you start using CHG soap. Use a clean washcloth (not used since being washed) for each shower. DO NOT sleep with pets once you start using the CHG.   CHG Shower Instructions:  If you choose to wash your hair and private area, wash first with your normal shampoo/soap.  After you use shampoo/soap, rinse your hair and body thoroughly to remove shampoo/soap residue.  Turn the water OFF and apply about 3 tablespoons (45 ml) of CHG soap to a CLEAN washcloth.  Apply CHG soap ONLY FROM YOUR NECK DOWN TO YOUR TOES (washing for 3-5 minutes)  DO NOT use CHG soap on face, private areas, open wounds, or sores.  Pay special attention to the area where your surgery is being performed.  If you are having back surgery, having someone wash your back for you may be helpful. Wait 2 minutes after CHG soap is applied, then you may rinse off the CHG soap.  Pat dry with a clean towel  Put on clean clothes/pajamas   If you choose to wear lotion, please use ONLY the CHG-compatible lotions on the back of this paper.     Additional instructions for the day of surgery: DO NOT APPLY any lotions, deodorants, cologne, or perfumes.   Put on clean/comfortable clothes.  Brush your teeth.  Ask your nurse before applying any prescription medications to the skin.      CHG Compatible Lotions   Aveeno Moisturizing lotion  Cetaphil Moisturizing Cream  Cetaphil Moisturizing Lotion  Clairol Herbal Essence Moisturizing Lotion, Dry Skin  Clairol Herbal Essence Moisturizing Lotion, Extra Dry Skin  Clairol Herbal Essence Moisturizing Lotion, Normal Skin  Curel Age Defying Therapeutic Moisturizing Lotion with Alpha Hydroxy  Curel Extreme Care Body Lotion  Curel  Soothing Hands Moisturizing Hand Lotion  Curel Therapeutic Moisturizing Cream, Fragrance-Free  Curel Therapeutic Moisturizing Lotion, Fragrance-Free  Curel Therapeutic Moisturizing Lotion, Original Formula  Eucerin Daily Replenishing Lotion  Eucerin Dry Skin Therapy Plus Alpha Hydroxy Crme  Eucerin Dry Skin Therapy Plus Alpha  Hydroxy Lotion  Eucerin Original Crme  Eucerin Original Lotion  Eucerin Plus Crme Eucerin Plus Lotion  Eucerin TriLipid Replenishing Lotion  Keri Anti-Bacterial Hand Lotion  Keri Deep Conditioning Original Lotion Dry Skin Formula Softly Scented  Keri Deep Conditioning Original Lotion, Fragrance Free Sensitive Skin Formula  Keri Lotion Fast Absorbing Fragrance Free Sensitive Skin Formula  Keri Lotion Fast Absorbing Softly Scented Dry Skin Formula  Keri Original Lotion  Keri Skin Renewal Lotion Keri Silky Smooth Lotion  Keri Silky Smooth Sensitive Skin Lotion  Nivea Body Creamy Conditioning Oil  Nivea Body Extra Enriched Lotion  Nivea Body Original Lotion  Nivea Body Sheer Moisturizing Lotion Nivea Crme  Nivea Skin Firming Lotion  NutraDerm 30 Skin Lotion  NutraDerm Skin Lotion  NutraDerm Therapeutic Skin Cream  NutraDerm Therapeutic Skin Lotion  ProShield Protective Hand Cream  Provon moisturizing lotion [facility]- Preparing for Total Shoulder Arthroplasty    Before surgery, you can play an important role. Because skin is not sterile, your skin needs to be as free of germs as possible. You can reduce the number of germs on your skin by using the following products. Benzoyl Peroxide Gel Reduces the number of germs present on the skin Applied twice a day to shoulder area starting two days before surgery    ==================================================================  Please follow these instructions carefully:  BENZOYL PEROXIDE 5% GEL  Please do not use if you have an allergy to benzoyl peroxide.   If your skin becomes reddened/irritated stop  using the benzoyl peroxide.  Starting two days before surgery, apply as follows: Apply benzoyl peroxide in the morning and at night. Apply after taking a shower. If you are not taking a shower clean entire shoulder front, back, and side along with the armpit with a clean wet washcloth.  Place a quarter-sized dollop on your shoulder and rub in thoroughly, making sure to cover the front, back, and side of your shoulder, along with the armpit.   2 days before ____ AM   ____ PM              1 day before ____ AM   ____ PM                         Do this twice a day for two days.  (Last application is the night before surgery, AFTER using the CHG soap as described below).  Do NOT apply benzoyl peroxide gel on the day of surgery.      .Incentive Spirometer (Watch this video at home: [email or web address])  An incentive spirometer is a tool that can help keep your lungs clear and active. This tool measures how well you are filling your lungs with each breath. Taking long deep breaths may help reverse or decrease the chance of developing breathing (pulmonary) problems (especially infection) following: A long period of time when you are unable to move or be active. BEFORE THE PROCEDURE  If the spirometer includes an indicator to show your best effort, your nurse or respiratory therapist will set it to a desired goal. If possible, sit up straight or lean slightly forward. Try not to slouch. Hold the incentive spirometer in an upright position. INSTRUCTIONS FOR USE  Sit on the edge of your bed if possible, or sit up as far as you can in bed or on a chair. Hold the incentive spirometer in an upright position. Breathe out normally. Place the mouthpiece in your mouth and seal your lips tightly  around it. Breathe in slowly and as deeply as possible, raising the piston or the ball toward the top of the column. Hold your breath for 3-5 seconds or for as long as possible. Allow the  piston or ball to fall to the bottom of the column. Remove the mouthpiece from your mouth and breathe out normally. Rest for a few seconds and repeat Steps 1 through 7 at least 10 times every 1-2 hours when you are awake. Take your time and take a few normal breaths between deep breaths. The spirometer may include an indicator to show your best effort. Use the indicator as a goal to work toward during each repetition. After each set of 10 deep breaths, practice coughing to be sure your lungs are clear. If you have an incision (the cut made at the time of surgery), support your incision when coughing by placing a pillow or rolled up towels firmly against it. Once you are able to get out of bed, walk around indoors and cough well. You may stop using the incentive spirometer when instructed by your caregiver.  RISKS AND COMPLICATIONS Take your time so you do not get dizzy or light-headed. If you are in pain, you may need to take or ask for pain medication before doing incentive spirometry. It is harder to take a deep breath if you are having pain. AFTER USE Rest and breathe slowly and easily. It can be helpful to keep track of a log of your progress. Your caregiver can provide you with a simple table to help with this. If you are using the spirometer at home, follow these instructions: SEEK MEDICAL CARE IF:  You are having difficultly using the spirometer. You have trouble using the spirometer as often as instructed. Your pain medication is not giving enough relief while using the spirometer. You develop fever of 100.5 F (38.1 C) or higher. SEEK IMMEDIATE MEDICAL CARE IF:  You cough up bloody sputum that had not been present before. You develop fever of 102 F (38.9 C) or greater. You develop worsening pain at or near the incision site. MAKE SURE YOU:  Understand these instructions. Will watch your condition. Will get help right away if you are not doing well or get worse. Document  Released: 08/07/2006 Document Revised: 06/19/2011 Document Reviewed: 10/08/2006 [facility] Patient Information 2014 [place], [place]. WHAT IS A BLOOD TRANSFUSION? Blood Transfusion Information  A transfusion is the replacement of blood or some of its parts. Blood is made up of multiple cells which provide different functions. Red blood cells carry oxygen and are used for blood loss replacement. White blood cells fight against infection. Platelets control bleeding. Plasma helps clot blood. Other blood products are available for specialized needs, such as hemophilia or other clotting disorders. BEFORE THE TRANSFUSION  Who gives blood for transfusions?  Healthy volunteers who are fully evaluated to make sure their blood is safe. This is blood bank blood. Transfusion therapy is the safest it has ever been in the practice of medicine. Before blood is taken from a donor, a complete history is taken to make sure that person has no history of diseases nor engages in risky social behavior (examples are intravenous drug use or sexual activity with multiple partners). The donor's travel history is screened to minimize risk of transmitting infections, such as malaria. The donated blood is tested for signs of infectious diseases, such as HIV and hepatitis. The blood is then tested to be sure it is compatible with you in order to  minimize the chance of a transfusion reaction. If you or a relative donates blood, this is often done in anticipation of surgery and is not appropriate for emergency situations. It takes many days to process the donated blood. RISKS AND COMPLICATIONS Although transfusion therapy is very safe and saves many lives, the main dangers of transfusion include:  Getting an infectious disease. Developing a transfusion reaction. This is an allergic reaction to something in the blood you were given. Every precaution is taken to prevent this. The decision to have a blood transfusion has been  considered carefully by your caregiver before blood is given. Blood is not given unless the benefits outweigh the risks. AFTER THE TRANSFUSION Right after receiving a blood transfusion, you will usually feel much better and more energetic. This is especially true if your red blood cells have gotten low (anemic). The transfusion raises the level of the red blood cells which carry oxygen, and this usually causes an energy increase. The nurse administering the transfusion will monitor you carefully for complications. HOME CARE INSTRUCTIONS  No special instructions are needed after a transfusion. You may find your energy is better. Speak with your caregiver about any limitations on activity for underlying diseases you may have. SEEK MEDICAL CARE IF:  Your condition is not improving after your transfusion. You develop redness or irritation at the intravenous (IV) site. SEEK IMMEDIATE MEDICAL CARE IF:  Any of the following symptoms occur over the next 12 hours: Shaking chills. You have a temperature by mouth above 102 F (38.9 C), not controlled by medicine. Chest, back, or muscle pain. People around you feel you are not acting correctly or are confused. Shortness of breath or difficulty breathing. Dizziness and fainting. You get a rash or develop hives. You have a decrease in urine output. Your urine turns a dark color or changes to pink, red, or brown. Any of the following symptoms occur over the next 10 days: You have a temperature by mouth above 102 F (38.9 C), not controlled by medicine. Shortness of breath. Weakness after normal activity. The white part of the eye turns yellow (jaundice). You have a decrease in the amount of urine or are urinating less often. Your urine turns a dark color or changes to pink, red, or brown. Document Released: 03/24/2000 Document Revised: 06/19/2011 Document Reviewed: 11/11/2007 [facility] Patient Information 2014 [place], [place].Marland Kitchen

## 2022-09-21 NOTE — Progress Notes (Addendum)
COVID Vaccine received:  [x]  No []  Yes Date of any COVID positive Test in last 90 days:No  PCP - DR. Eustaquio Boyden Cardiologist - No  Chest x-ray CT chest 2016 EPIC EKG -  10/04/14  EPIC Stress Test - No ECHO - No Cardiac Cath -  No Bowel Prep - [x]  No  []   Yes ______  Pacemaker / ICD device [x]  No []  Yes   Spinal Cord Stimulator:[x]  No []  Yes       History of Sleep Apnea? [x]  No []  Yes   CPAP used?- [x]  No []  Yes    Does the patient monitor blood sugar?          [x]  No []  Yes  []  N/A  Patient has: [x]  NO Hx DM   []  Pre-DM                 []  DM1  []   DM2 Does patient have a [employer] or Dexacom? [x]  No []  Yes   Fasting Blood Sugar Ranges-  Checks Blood Sugar _____ times a day  GLP1 agonist / usual dose - No GLP1 instructions:  SGLT-2 inhibitors / usual dose - No SGLT-2 instructions:   Blood Thinner / Instructions:No Aspirin Instructions:No  Comments:   Activity level: Patient is able to climb a flight of stairs without difficulty; [x]  No CP  [x]  No SOB.   Patient canperform ADLs without assistance.   Anesthesia review:   Patient denies shortness of breath, fever, cough and chest pain at PAT appointment.  Patient verbalized understanding and agreement to the Pre-Surgical Instructions that were given to them at this PAT appointment. Patient was also educated of the need to review these PAT instructions again prior to his/her surgery.I reviewed the appropriate phone numbers to call if they have any and questions or concerns.

## 2022-09-22 ENCOUNTER — Encounter (HOSPITAL_COMMUNITY): Payer: Self-pay

## 2022-09-22 ENCOUNTER — Encounter (HOSPITAL_COMMUNITY): Admission: RE | Admit: 2022-09-22 | Payer: Medicare Other | Source: Ambulatory Visit

## 2022-09-25 ENCOUNTER — Encounter (HOSPITAL_COMMUNITY)
Admission: RE | Admit: 2022-09-25 | Discharge: 2022-09-25 | Disposition: A | Discharge status: Home / Self Care | Payer: Medicare Other | Source: Ambulatory Visit | Attending: Orthopaedic Surgery | Admitting: Orthopaedic Surgery

## 2022-09-25 ENCOUNTER — Encounter (HOSPITAL_COMMUNITY): Payer: Self-pay

## 2022-09-25 ENCOUNTER — Other Ambulatory Visit: Payer: Self-pay

## 2022-09-25 VITALS — BP 133/100 | HR 93 | Temp 98.3°F | Resp 16 | Ht 68.0 in | Wt 151.6 lb

## 2022-09-25 DIAGNOSIS — Z01818 Encounter for other preprocedural examination: Secondary | ICD-10-CM | POA: Insufficient documentation

## 2022-09-25 DIAGNOSIS — D649 Anemia, unspecified: Secondary | ICD-10-CM | POA: Diagnosis not present

## 2022-09-25 LAB — SURGICAL PCR SCREEN
MRSA, PCR: NEGATIVE
Staphylococcus aureus: NEGATIVE

## 2022-09-25 LAB — CBC
HCT: 37.6 % (ref 36.0–46.0)
Hemoglobin: 11.9 g/dL — ABNORMAL LOW (ref 12.0–15.0)
MCH: 31.4 pg (ref 26.0–34.0)
MCHC: 31.6 g/dL (ref 30.0–36.0)
MCV: 99.2 fL (ref 80.0–100.0)
Platelets: 347 10*3/uL (ref 150–400)
RBC: 3.79 MIL/uL — ABNORMAL LOW (ref 3.87–5.11)
RDW: 14.2 % (ref 11.5–15.5)
WBC: 6.4 10*3/uL (ref 4.0–10.5)
nRBC: 0 % (ref 0.0–0.2)

## 2022-09-25 LAB — BASIC METABOLIC PANEL
Anion gap: 6 (ref 5–15)
BUN: 14 mg/dL (ref 8–23)
CO2: 29 mmol/L (ref 22–32)
Calcium: 9.5 mg/dL (ref 8.9–10.3)
Chloride: 104 mmol/L (ref 98–111)
Creatinine, Ser: 0.63 mg/dL (ref 0.44–1.00)
GFR, Estimated: >60 mL/min (ref >60)
Glucose, Bld: 87 mg/dL (ref 70–99)
Potassium: 4.4 mmol/L (ref 3.5–5.1)
Sodium: 139 mmol/L (ref 135–145)

## 2022-10-03 NOTE — H&P (Signed)
PREOPERATIVE H&P  Chief Complaint: right shoulder failed hardware/complication  HPI: Dawn Chen is a 62 y.o. female who is scheduled for Procedure(s): TOTAL SHOULDER REVISION SYNOVECTOMY.   Patient has a past medical history significant for HTN and HLD.   She has a very complex history of right shoulder pain and previous surgical history. She was previously seen at [facility] in 2016. Per chart review she had a right shoulder arthroscopy 10 years  prior to that done at an outside facility. 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 She had a right anatomic total shoulder replacement  on 07-30-2014. Again, per chart review it looks like she had some initial non-compliance and at her first post operative visit there was a concern for subscapularis failure. She then underwent a right shoulder subscapularis repair on 08-18-2014. It looks like she was lost to follow-up after  August 2016. Per patient report she had a revision, anatomic total shoulder arthroplasty with Dr. Griffith Citron with [facility] in May 2017.  She is frustrated by the function of her right shoulder.   Symptoms are rated as moderate to severe, and have been worsening.  This is significantly impairing activities of daily living.    Please see clinic note for further details on this patient's care.    She has elected for surgical management.   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   Past Surgical History:  Procedure Laterality Date   ABDOMINAL HYSTERECTOMY     CARPAL TUNNEL RELEASE Bilateral 2001   CHOLECYSTECTOMY     GASTRIC BYPASS  2013   GYNECOLOGIC CRYOSURGERY     HYSTEROTOMY   2007   JOINT REPLACEMENT Right 03/2013   total knee   LUMBAR FUSION  2014   SHOULDER ACROMIOPLASTY Right 2011   SHOULDER ARTHROSCOPY WITH ROTATOR CUFF REPAIR Right 08/18/2014   Procedure: RIGHT SHOULDER  SUBSCAPULARIS REPAIR;  Surgeon: Jones Broom, MD;  Location: [facility] OR;  Service: Orthopedics;  Laterality: Right;  Right shoulder subscapularis repair   TOTAL SHOULDER ARTHROPLASTY Right 07/30/2014   Procedure: TOTAL SHOULDER ARTHROPLASTY;  Surgeon: Jones Broom, MD;  Location: [facility] OR;  Service: Orthopedics;  Laterality: Right;  Right shoulder arthroplasty   Social History   Socioeconomic History   Marital status: Divorced    Spouse name: Not on file   Number of children: Not on file   Years of education: Not on file   Highest education level: Not on file  Occupational History   Not on file  Tobacco Use   Smoking status: Former    Packs/day: .5    Types: Cigarettes    Quit date: 09/08/2017    Years since quitting: 5.0   Smokeless tobacco: Never   Tobacco comments:    Smoking varies  Substance and Sexual Activity   Alcohol use: No   Drug use: No   Sexual activity: Not on file  Other Topics Concern   Not on file  Social History Narrative   Not on file   Social Determinants of Health   Financial Resource Strain: Not on file  Food Insecurity:  Not on file  Transportation Needs: Not on file  Physical Activity: Not on file  Stress: Not on file  Social Connections: Not on file   Family History  Problem Relation Age of Onset   Breast cancer Maternal Aunt    Cancer Maternal Aunt    Breast cancer Maternal Grandmother    Cancer Maternal Grandmother    Allergies  Allergen Reactions   Diflucan [Fluconazole] Other (See Comments)    Headache    Gabapentin     Pass out    Hydrochlorothiazide Other (See Comments)    Dizzy   Methadone Nausea And Vomiting   Pregabalin Other (See Comments)    Unable to function   Prior to Admission medications   Medication Sig Start Date  End Date Taking? Authorizing Provider  acetaminophen (TYLENOL) 500 MG tablet Take 1,000 mg by mouth 2 (two) times daily as needed for moderate pain.   Yes [provider]  AMITIZA 24 MCG capsule Take 24 mcg by mouth 2 (two) times daily. 11/04/17  Yes [provider]  chlorhexidine (PERIDEX) 0.12 % solution Use as directed 15 mLs in the mouth or throat daily as needed (mouth pain).   Yes [provider]  Cholecalciferol (VITAMIN D3) 250 MCG (10000 UT) capsule Take 10,000 Units by mouth daily.   Yes [provider]  conjugated estrogens (PREMARIN) vaginal cream Place 1 applicator vaginally daily as needed (dryness/irritation).   Yes [provider]  cyclobenzaprine (FLEXERIL) 10 MG tablet Take 10 mg by mouth 2 (two) times daily as needed for muscle spasms. 10/10/17  Yes [provider]  ferrous sulfate 325 (65 FE) MG tablet Take 325 mg by mouth daily.   Yes [provider]  ibuprofen (ADVIL) 800 MG tablet Take 800 mg by mouth every 6 (six) hours as needed for moderate pain.   Yes [provider]  Magnesium Bisglycinate (MAG GLYCINATE PO) Take 2 tablets by mouth 2 (two) times daily.   Yes [provider]  omeprazole (PRILOSEC OTC) 20 MG tablet Take 20 mg by mouth daily as needed (acid reflux).   Yes [provider]  oxycodone (ROXICODONE) 30 MG immediate release tablet Take 30 mg by mouth 5 (five) times daily as needed for pain. 10/17/17  Yes [provider]  terbinafine (LAMISIL) 250 MG tablet Take 1 tablet (250 mg total) by mouth daily. 04/30/19  Yes Candelaria Stagers, DPM  traZODone (DESYREL) 100 MG tablet Take 100 mg by mouth at bedtime as needed for sleep.   Yes [provider]  triamcinolone cream (KENALOG) 0.1 % Apply 1 Application topically daily as needed (rash).   Yes [provider]  Vitamin D, Ergocalciferol, (DRISDOL) 1.25 MG (50000 UNIT) CAPS capsule Take 50,000 Units by mouth every  7 (seven) days.   Yes [provider]    ROS: All other systems have been reviewed and were otherwise negative with the exception of those mentioned in the HPI and as above.  Physical Exam: General: Alert, no acute distress Cardiovascular: No pedal edema Respiratory: No cyanosis, no use of accessory musculature GI: No organomegaly, abdomen is soft and non-tender Skin: No lesions in the area of chief complaint Neurologic: Sensation intact distally Psychiatric: Patient is competent for consent with normal mood and affect Lymphatic: No axillary or cervical lymphadenopathy  MUSCULOSKELETAL:  Range of motion of the right shoulder to about 80 degrees, passively 90.  Internal rotation to L1.  External rotation is full.    Imaging: X-rays  of the right shoulder demonstrate proximal migration of the anatomic total shoulder arthroplasty. The hardware appears to be in place without any hardware complicating features.   BMI: Estimated body mass index is 23.05 kg/m as calculated from the following:   Height as of 09/25/22: 5\' 8"  (1.727 m).   Weight as of 09/25/22: 68.8 kg.  Lab Results  Component Value Date   ALBUMIN 3.9 09/30/2019   Diabetes: Patient does not have a diagnosis of diabetes.     Smoking Status:       Assessment: right shoulder failed hardware/complication  Plan: Plan for Procedure(s): TOTAL SHOULDER REVISION SYNOVECTOMY  Patient has consideration for revision surgery at this point.  We talked about the specific risks, including infection and need for antibiotics after.  Possibility of a single stage versus two stage surgery was discussed.  We will get a CT with blueprint protocol to plan for her revision.  This will be done [facility] with an overnight stay.    The risks benefits and alternatives were discussed with the patient including but not limited to the risks of nonoperative treatment, versus surgical intervention including infection,  bleeding, nerve injury,  blood clots, cardiopulmonary complications, morbidity, mortality, among others, and they were willing to proceed.   We additionally specifically discussed risks of axillary nerve injury, infection, periprosthetic fracture, continued pain and longevity of implants prior to beginning procedure.    Patient will be closely monitored in PACU for medical stabilization and pain control. If found stable in PACU, patient may be discharged home with outpatient follow-up. If any concerns regarding patient's stabilization patient will be admitted for observation after surgery. The patient is planning to be discharged home with outpatient PT.   The patient acknowledged the explanation, agreed to proceed with the plan and consent was signed.   She received operative clearance from her PCP, Dr. Jackelyn Poling.  Need to discuss with pain management provider, Saundra Shelling.   Operative Plan: Right shoulder revision to reverse versus hemiarthroplasty with antibiotic spacer Discharge Medications: standard  DVT Prophylaxis: aspirin Physical Therapy: outpatient PT - delayed Special Discharge needs: Sling. IceMan   Vernetta Honey, PA-C  10/03/2022 4:31 PM

## 2022-10-04 ENCOUNTER — Encounter (HOSPITAL_COMMUNITY)
Admission: RE | Disposition: A | Discharge status: Home / Self Care | Payer: Self-pay | Source: Home / Self Care | Attending: Orthopaedic Surgery

## 2022-10-04 ENCOUNTER — Inpatient Hospital Stay (HOSPITAL_COMMUNITY): Payer: Medicare Other

## 2022-10-04 ENCOUNTER — Inpatient Hospital Stay (HOSPITAL_COMMUNITY): Payer: Medicare Other | Admitting: Certified Registered Nurse Anesthetist

## 2022-10-04 ENCOUNTER — Encounter (HOSPITAL_COMMUNITY): Payer: Self-pay | Admitting: Orthopaedic Surgery

## 2022-10-04 ENCOUNTER — Other Ambulatory Visit: Payer: Self-pay

## 2022-10-04 ENCOUNTER — Inpatient Hospital Stay (HOSPITAL_COMMUNITY)
Admission: RE | Admit: 2022-10-04 | Discharge: 2022-10-06 | DRG: 497 | Disposition: A | Discharge status: Home / Self Care | Payer: Medicare Other | Attending: Orthopaedic Surgery | Admitting: Orthopaedic Surgery

## 2022-10-04 DIAGNOSIS — I10 Essential (primary) hypertension: Secondary | ICD-10-CM | POA: Diagnosis present

## 2022-10-04 DIAGNOSIS — Z87891 Personal history of nicotine dependence: Secondary | ICD-10-CM | POA: Diagnosis not present

## 2022-10-04 DIAGNOSIS — E785 Hyperlipidemia, unspecified: Secondary | ICD-10-CM | POA: Diagnosis present

## 2022-10-04 DIAGNOSIS — Z9049 Acquired absence of other specified parts of digestive tract: Secondary | ICD-10-CM

## 2022-10-04 DIAGNOSIS — Z90710 Acquired absence of both cervix and uterus: Secondary | ICD-10-CM | POA: Diagnosis not present

## 2022-10-04 DIAGNOSIS — Z79899 Other long term (current) drug therapy: Secondary | ICD-10-CM

## 2022-10-04 DIAGNOSIS — M899 Disorder of bone, unspecified: Secondary | ICD-10-CM | POA: Diagnosis present

## 2022-10-04 DIAGNOSIS — T8459XA Infection and inflammatory reaction due to other internal joint prosthesis, initial encounter: Secondary | ICD-10-CM

## 2022-10-04 DIAGNOSIS — Y831 Surgical operation with implant of artificial internal device as the cause of abnormal reaction of the patient, or of later complication, without mention of misadventure at the time of the procedure: Secondary | ICD-10-CM | POA: Diagnosis present

## 2022-10-04 DIAGNOSIS — Z803 Family history of malignant neoplasm of breast: Secondary | ICD-10-CM

## 2022-10-04 DIAGNOSIS — Z981 Arthrodesis status: Secondary | ICD-10-CM | POA: Diagnosis not present

## 2022-10-04 DIAGNOSIS — F418 Other specified anxiety disorders: Secondary | ICD-10-CM

## 2022-10-04 DIAGNOSIS — Z9884 Bariatric surgery status: Secondary | ICD-10-CM

## 2022-10-04 DIAGNOSIS — Z888 Allergy status to other drugs, medicaments and biological substances status: Secondary | ICD-10-CM

## 2022-10-04 DIAGNOSIS — Z96611 Presence of right artificial shoulder joint: Secondary | ICD-10-CM | POA: Diagnosis not present

## 2022-10-04 DIAGNOSIS — M75101 Unspecified rotator cuff tear or rupture of right shoulder, not specified as traumatic: Secondary | ICD-10-CM

## 2022-10-04 DIAGNOSIS — T84038A Mechanical loosening of other internal prosthetic joint, initial encounter: Secondary | ICD-10-CM | POA: Diagnosis present

## 2022-10-04 DIAGNOSIS — Z885 Allergy status to narcotic agent status: Secondary | ICD-10-CM

## 2022-10-04 DIAGNOSIS — Z96651 Presence of right artificial knee joint: Secondary | ICD-10-CM | POA: Diagnosis present

## 2022-10-04 DIAGNOSIS — M75100 Unspecified rotator cuff tear or rupture of unspecified shoulder, not specified as traumatic: Secondary | ICD-10-CM | POA: Diagnosis present

## 2022-10-04 HISTORY — PX: TOTAL SHOULDER REVISION: SHX6130

## 2022-10-04 HISTORY — PX: SYNOVECTOMY: SHX5180

## 2022-10-04 LAB — SEDIMENTATION RATE: Sed Rate: 6 mm/hr (ref 0–22)

## 2022-10-04 LAB — TYPE AND SCREEN
ABO/RH(D): A POS
Antibody Screen: NEGATIVE

## 2022-10-04 LAB — C-REACTIVE PROTEIN: CRP: <0.5 mg/dL (ref <1.0)

## 2022-10-04 LAB — AEROBIC/ANAEROBIC CULTURE W GRAM STAIN (SURGICAL/DEEP WOUND)

## 2022-10-04 SURGERY — REVISION, TOTAL ARTHROPLASTY, SHOULDER
Anesthesia: General | Site: Shoulder | Laterality: Right

## 2022-10-04 MED ORDER — ACETAMINOPHEN 10 MG/ML IV SOLN: 1000.0000 mg | Freq: Once | INTRAVENOUS | Status: DC | PRN

## 2022-10-04 MED ORDER — CEFAZOLIN SODIUM-DEXTROSE 2-4 GM/100ML-% IV SOLN
2.0000 g | INTRAVENOUS | Status: AC
  Administered 2022-10-04: 2 g via INTRAVENOUS
  Filled 2022-10-04: qty 100

## 2022-10-04 MED ORDER — SODIUM CHLORIDE 0.9 % IV SOLN
500.0000 mg | Freq: Every day | INTRAVENOUS | Status: DC
  Administered 2022-10-04 – 2022-10-06 (×3): 500 mg via INTRAVENOUS
  Filled 2022-10-04 (×4): qty 10

## 2022-10-04 MED ORDER — DEXAMETHASONE SODIUM PHOSPHATE 4 MG/ML IJ SOLN
INTRAMUSCULAR | Status: DC | PRN
  Administered 2022-10-04: 5 mg via INTRAVENOUS

## 2022-10-04 MED ORDER — NAPROXEN 250 MG PO TABS
250.0000 mg | ORAL_TABLET | Freq: Two times a day (BID) | ORAL | Status: DC
  Administered 2022-10-04 – 2022-10-06 (×2): 250 mg via ORAL
  Filled 2022-10-04 (×3): qty 1

## 2022-10-04 MED ORDER — PRONTOSAN WOUND IRRIGATION OPTIME
TOPICAL | Status: DC | PRN
  Administered 2022-10-04: 1 via TOPICAL

## 2022-10-04 MED ORDER — MAGNESIUM CITRATE PO SOLN: 1.0000 | Freq: Once | ORAL | Status: DC | PRN

## 2022-10-04 MED ORDER — SUGAMMADEX SODIUM 200 MG/2ML IV SOLN
INTRAVENOUS | Status: DC | PRN
  Administered 2022-10-04: 200 mg via INTRAVENOUS

## 2022-10-04 MED ORDER — METOCLOPRAMIDE HCL 5 MG/ML IJ SOLN: 5.0000 mg | Freq: Three times a day (TID) | INTRAMUSCULAR | Status: DC | PRN

## 2022-10-04 MED ORDER — ACETAMINOPHEN 500 MG PO TABS: 1000.0000 mg | ORAL_TABLET | Freq: Once | ORAL | Status: DC

## 2022-10-04 MED ORDER — TRANEXAMIC ACID-NACL 1000-0.7 MG/100ML-% IV SOLN
1000.0000 mg | INTRAVENOUS | Status: AC
  Administered 2022-10-04: 1000 mg via INTRAVENOUS
  Filled 2022-10-04: qty 100

## 2022-10-04 MED ORDER — FENTANYL CITRATE PF 50 MCG/ML IJ SOSY
50.0000 ug | PREFILLED_SYRINGE | INTRAMUSCULAR | Status: AC
  Administered 2022-10-04: 50 ug via INTRAVENOUS
  Filled 2022-10-04: qty 2

## 2022-10-04 MED ORDER — POLYETHYLENE GLYCOL 3350 17 G PO PACK: 17.0000 g | PACK | Freq: Every day | ORAL | Status: DC | PRN

## 2022-10-04 MED ORDER — STERILE WATER FOR IRRIGATION IR SOLN
Status: DC | PRN
  Administered 2022-10-04: 2000 mL

## 2022-10-04 MED ORDER — CHLORHEXIDINE GLUCONATE 0.12 % MT SOLN
15.0000 mL | Freq: Once | OROMUCOSAL | Status: AC
  Administered 2022-10-04: 15 mL via OROMUCOSAL

## 2022-10-04 MED ORDER — ONDANSETRON HCL 4 MG/2ML IJ SOLN
INTRAMUSCULAR | Status: AC
  Filled 2022-10-04: qty 2

## 2022-10-04 MED ORDER — MENTHOL 3 MG MT LOZG: 1.0000 | LOZENGE | OROMUCOSAL | Status: DC | PRN

## 2022-10-04 MED ORDER — DEXAMETHASONE SODIUM PHOSPHATE 10 MG/ML IJ SOLN
INTRAMUSCULAR | Status: AC
  Filled 2022-10-04: qty 1

## 2022-10-04 MED ORDER — VANCOMYCIN HCL 1500 MG/300ML IV SOLN
1500.0000 mg | Freq: Once | INTRAVENOUS | Status: DC
  Filled 2022-10-04: qty 300

## 2022-10-04 MED ORDER — OXYCODONE HCL 5 MG/5ML PO SOLN: 5.0000 mg | Freq: Once | ORAL | Status: DC | PRN

## 2022-10-04 MED ORDER — MORPHINE SULFATE (PF) 2 MG/ML IV SOLN: 0.5000 mg | INTRAVENOUS | Status: DC | PRN

## 2022-10-04 MED ORDER — METHOCARBAMOL 500 MG PO TABS
500.0000 mg | ORAL_TABLET | Freq: Four times a day (QID) | ORAL | Status: DC | PRN
  Administered 2022-10-05 – 2022-10-06 (×4): 500 mg via ORAL
  Filled 2022-10-04 (×4): qty 1

## 2022-10-04 MED ORDER — BISACODYL 10 MG RE SUPP: 10.0000 mg | Freq: Every day | RECTAL | Status: DC | PRN

## 2022-10-04 MED ORDER — SODIUM CHLORIDE 0.9 % IV SOLN
2.0000 g | INTRAVENOUS | Status: DC
  Administered 2022-10-04 – 2022-10-06 (×3): 2 g via INTRAVENOUS
  Filled 2022-10-04 (×3): qty 20

## 2022-10-04 MED ORDER — LIDOCAINE 2% (20 MG/ML) 5 ML SYRINGE
INTRAMUSCULAR | Status: DC | PRN
  Administered 2022-10-04: 100 mg via INTRAVENOUS

## 2022-10-04 MED ORDER — LIDOCAINE HCL (PF) 2 % IJ SOLN
INTRAMUSCULAR | Status: AC
  Filled 2022-10-04: qty 5

## 2022-10-04 MED ORDER — BUPIVACAINE LIPOSOME 1.3 % IJ SUSP
INTRAMUSCULAR | Status: DC | PRN
  Administered 2022-10-04: 10 mL

## 2022-10-04 MED ORDER — ROCURONIUM BROMIDE 10 MG/ML (PF) SYRINGE
PREFILLED_SYRINGE | INTRAVENOUS | Status: DC | PRN
  Administered 2022-10-04: 60 mg via INTRAVENOUS

## 2022-10-04 MED ORDER — PHENOL 1.4 % MT LIQD: 1.0000 | OROMUCOSAL | Status: DC | PRN

## 2022-10-04 MED ORDER — LUBIPROSTONE 24 MCG PO CAPS
24.0000 ug | ORAL_CAPSULE | Freq: Two times a day (BID) | ORAL | Status: DC
  Filled 2022-10-04 (×4): qty 1

## 2022-10-04 MED ORDER — KETAMINE HCL 50 MG/5ML IJ SOSY
PREFILLED_SYRINGE | INTRAMUSCULAR | Status: AC
  Filled 2022-10-04: qty 5

## 2022-10-04 MED ORDER — DOCUSATE SODIUM 100 MG PO CAPS
100.0000 mg | ORAL_CAPSULE | Freq: Two times a day (BID) | ORAL | Status: DC
  Filled 2022-10-04 (×4): qty 1

## 2022-10-04 MED ORDER — OXYCODONE HCL 5 MG PO TABS
30.0000 mg | ORAL_TABLET | Freq: Every day | ORAL | Status: DC | PRN
  Administered 2022-10-04 – 2022-10-06 (×7): 30 mg via ORAL
  Filled 2022-10-04 (×7): qty 6

## 2022-10-04 MED ORDER — METOCLOPRAMIDE HCL 5 MG PO TABS: 5.0000 mg | ORAL_TABLET | Freq: Three times a day (TID) | ORAL | Status: DC | PRN

## 2022-10-04 MED ORDER — PANTOPRAZOLE SODIUM 40 MG PO TBEC
40.0000 mg | DELAYED_RELEASE_TABLET | Freq: Every day | ORAL | Status: DC
  Administered 2022-10-04: 40 mg via ORAL
  Filled 2022-10-04 (×4): qty 1

## 2022-10-04 MED ORDER — LACTATED RINGERS IV SOLN: INTRAVENOUS | Status: DC

## 2022-10-04 MED ORDER — FENTANYL CITRATE (PF) 100 MCG/2ML IJ SOLN
INTRAMUSCULAR | Status: DC | PRN
  Administered 2022-10-04: 50 ug via INTRAVENOUS

## 2022-10-04 MED ORDER — MINERAL OIL LIGHT OIL
TOPICAL_OIL | Status: AC
  Filled 2022-10-04: qty 10

## 2022-10-04 MED ORDER — PHENYLEPHRINE HCL-NACL 20-0.9 MG/250ML-% IV SOLN
INTRAVENOUS | Status: DC | PRN
  Administered 2022-10-04: 30 ug/min via INTRAVENOUS

## 2022-10-04 MED ORDER — ACETAMINOPHEN 500 MG PO TABS
1000.0000 mg | ORAL_TABLET | Freq: Four times a day (QID) | ORAL | Status: AC
  Administered 2022-10-04 – 2022-10-05 (×4): 1000 mg via ORAL
  Filled 2022-10-04 (×4): qty 2

## 2022-10-04 MED ORDER — METHOCARBAMOL 500 MG IVPB - SIMPLE MED: 500.0000 mg | Freq: Four times a day (QID) | INTRAVENOUS | Status: DC | PRN

## 2022-10-04 MED ORDER — BUPIVACAINE HCL (PF) 0.5 % IJ SOLN
INTRAMUSCULAR | Status: DC | PRN
  Administered 2022-10-04: 15 mL via PERINEURAL

## 2022-10-04 MED ORDER — FENTANYL CITRATE (PF) 100 MCG/2ML IJ SOLN
INTRAMUSCULAR | Status: AC
  Filled 2022-10-04: qty 2

## 2022-10-04 MED ORDER — VANCOMYCIN HCL 1000 MG IV SOLR
INTRAVENOUS | Status: AC
  Filled 2022-10-04: qty 20

## 2022-10-04 MED ORDER — VANCOMYCIN HCL 1000 MG IV SOLR
INTRAVENOUS | Status: DC | PRN
  Administered 2022-10-04: 1000 mg via TOPICAL

## 2022-10-04 MED ORDER — ALUM & MAG HYDROXIDE-SIMETH 200-200-20 MG/5ML PO SUSP: 30.0000 mL | ORAL | Status: DC | PRN

## 2022-10-04 MED ORDER — SODIUM CHLORIDE 0.9 % IV SOLN: 2.0000 g | INTRAVENOUS | Status: DC

## 2022-10-04 MED ORDER — DIPHENHYDRAMINE HCL 12.5 MG/5ML PO ELIX
12.5000 mg | ORAL_SOLUTION | ORAL | Status: DC | PRN
  Filled 2022-10-04: qty 10

## 2022-10-04 MED ORDER — ONDANSETRON HCL 4 MG/2ML IJ SOLN: 4.0000 mg | Freq: Four times a day (QID) | INTRAMUSCULAR | Status: DC | PRN

## 2022-10-04 MED ORDER — ONDANSETRON HCL 4 MG/2ML IJ SOLN
INTRAMUSCULAR | Status: DC | PRN
  Administered 2022-10-04: 4 mg via INTRAVENOUS

## 2022-10-04 MED ORDER — MIDAZOLAM HCL 2 MG/2ML IJ SOLN
1.0000 mg | INTRAMUSCULAR | Status: AC
  Administered 2022-10-04: 2 mg via INTRAVENOUS
  Filled 2022-10-04: qty 2

## 2022-10-04 MED ORDER — ONDANSETRON HCL 4 MG PO TABS: 4.0000 mg | ORAL_TABLET | Freq: Four times a day (QID) | ORAL | Status: DC | PRN

## 2022-10-04 MED ORDER — FENTANYL CITRATE PF 50 MCG/ML IJ SOSY: 25.0000 ug | PREFILLED_SYRINGE | INTRAMUSCULAR | Status: DC | PRN

## 2022-10-04 MED ORDER — ORAL CARE MOUTH RINSE: 15.0000 mL | Freq: Once | OROMUCOSAL | Status: AC

## 2022-10-04 MED ORDER — PROPOFOL 10 MG/ML IV BOLUS
INTRAVENOUS | Status: AC
  Filled 2022-10-04: qty 20

## 2022-10-04 MED ORDER — MINERAL OIL LIGHT 100 % EX OIL
TOPICAL_OIL | CUTANEOUS | Status: DC | PRN
  Administered 2022-10-04: 1 via TOPICAL

## 2022-10-04 MED ORDER — SODIUM CHLORIDE 0.9 % IR SOLN
Status: DC | PRN
  Administered 2022-10-04: 6000 mL
  Administered 2022-10-04: 1000 mL

## 2022-10-04 MED ORDER — TRAZODONE HCL 100 MG PO TABS
100.0000 mg | ORAL_TABLET | Freq: Every evening | ORAL | Status: DC | PRN
  Administered 2022-10-05: 100 mg via ORAL
  Filled 2022-10-04: qty 1

## 2022-10-04 MED ORDER — 0.9 % SODIUM CHLORIDE (POUR BTL) OPTIME
TOPICAL | Status: DC | PRN
  Administered 2022-10-04: 1000 mL

## 2022-10-04 MED ORDER — OXYCODONE HCL 5 MG PO TABS: 5.0000 mg | ORAL_TABLET | Freq: Once | ORAL | Status: DC | PRN

## 2022-10-04 MED ORDER — PROPOFOL 10 MG/ML IV BOLUS
INTRAVENOUS | Status: DC | PRN
  Administered 2022-10-04: 150 mg via INTRAVENOUS

## 2022-10-04 MED ORDER — ONDANSETRON HCL 4 MG/2ML IJ SOLN: 4.0000 mg | Freq: Once | INTRAMUSCULAR | Status: DC | PRN

## 2022-10-04 SURGICAL SUPPLY — 92 items
AID PSTN UNV HD RSTRNT DISP (MISCELLANEOUS) ×2
APL PRP STRL LF DISP 70% ISPRP (MISCELLANEOUS) ×4
APL SKNCLS NONHYPOALLERGENIC (GAUZE/BANDAGES/DRESSINGS) ×2
APL SKNCLS STERI-STRIP NONHPOA (GAUZE/BANDAGES/DRESSINGS) ×2
BAG COUNTER SPONGE SURGICOUNT (BAG) ×2 IMPLANT
BAG SPNG CNTER NS LX DISP (BAG) ×2
BENZOIN TINCTURE PRP APPL 2/3 (GAUZE/BANDAGES/DRESSINGS) ×2 IMPLANT
BIT DRILL 3.2 PERIPHERAL SCREW (BIT) IMPLANT
BLADE OSTEOTOME FLAT AEQ 4X3 (INSTRUMENTS) IMPLANT
BLADE SAW SAG 29X58X.64 (BLADE) IMPLANT
BLADE SAW SGTL 73X25 THK (BLADE) IMPLANT
BLADE SAW SGTL 81X20 HD (BLADE) ×2 IMPLANT
BONE CEMENT GENTAMICIN (Cement) ×6 IMPLANT
BRUSH FEMORAL CANAL (MISCELLANEOUS) IMPLANT
CEMENT BONE DEPUY (Cement) IMPLANT
CEMENT BONE GENTAMICIN 40 (Cement) IMPLANT
CHLORAPREP W/TINT 26 (MISCELLANEOUS) ×4 IMPLANT
CLSR STERI-STRIP ANTIMIC 1/2X4 (GAUZE/BANDAGES/DRESSINGS) ×2 IMPLANT
CNTNR URN SCR LID CUP LEK RST (MISCELLANEOUS) ×6 IMPLANT
CONT SPEC 4OZ STRL OR WHT (MISCELLANEOUS) ×6
COOLER ICEMAN CLASSIC (MISCELLANEOUS) ×2 IMPLANT
COVER BACK TABLE 60X90IN (DRAPES) IMPLANT
COVER SURGICAL LIGHT HANDLE (MISCELLANEOUS) ×2 IMPLANT
CUBES CANC 30CC PCANCUBE30 (Bone Implant) ×2 IMPLANT
DRAPE C-ARM 42X120 X-RAY (DRAPES) IMPLANT
DRAPE INCISE IOBAN 66X45 STRL (DRAPES) ×2 IMPLANT
DRAPE ORTHO SPLIT 77X108 STRL (DRAPES) ×4
DRAPE POUCH INSTRU U-SHP 10X18 (DRAPES) ×2 IMPLANT
DRAPE SHEET LG 3/4 BI-LAMINATE (DRAPES) ×2 IMPLANT
DRAPE SURG ORHT 6 SPLT 77X108 (DRAPES) ×4 IMPLANT
DRAPE TOP 10253 STERILE (DRAPES) ×2 IMPLANT
DRAPE U-SHAPE 47X51 STRL (DRAPES) ×2 IMPLANT
DRSG AQUACEL AG ADV 3.5X 6 (GAUZE/BANDAGES/DRESSINGS) IMPLANT
DRSG MEPILEX POST OP 4X8 (GAUZE/BANDAGES/DRESSINGS) IMPLANT
ELECT BLADE TIP CTD 4 INCH (ELECTRODE) ×2 IMPLANT
ELECT NDL TIP 2.8 STRL (NEEDLE) IMPLANT
ELECT NEEDLE TIP 2.8 STRL (NEEDLE) IMPLANT
ELECT REM PT RETURN 15FT ADLT (MISCELLANEOUS) ×2 IMPLANT
EVACUATOR 1/8 PVC DRAIN (DRAIN) IMPLANT
FACESHIELD WRAPAROUND (MASK) ×4 IMPLANT
FACESHIELD WRAPAROUND OR TEAM (MASK) ×4 IMPLANT
GAUZE PAD ABD 8X10 STRL (GAUZE/BANDAGES/DRESSINGS) ×2 IMPLANT
GAUZE SPONGE 4X4 12PLY STRL (GAUZE/BANDAGES/DRESSINGS) ×2 IMPLANT
GAUZE XEROFORM 1X8 LF (GAUZE/BANDAGES/DRESSINGS) ×2 IMPLANT
GLOVE BIO SURGEON STRL SZ 6.5 (GLOVE) ×2 IMPLANT
GLOVE BIOGEL PI IND STRL 6.5 (GLOVE) ×2 IMPLANT
GLOVE BIOGEL PI IND STRL 8 (GLOVE) ×2 IMPLANT
GLOVE ECLIPSE 8.0 STRL XLNG CF (GLOVE) ×2 IMPLANT
GOWN STRL REUS W/ TWL LRG LVL3 (GOWN DISPOSABLE) ×2 IMPLANT
GOWN STRL REUS W/ TWL XL LVL3 (GOWN DISPOSABLE) ×2 IMPLANT
GOWN STRL REUS W/TWL LRG LVL3 (GOWN DISPOSABLE) ×2
GOWN STRL REUS W/TWL XL LVL3 (GOWN DISPOSABLE) ×2
GRAFT BNE CANC CUBE 1X1X1 30CC (Bone Implant) IMPLANT
GUIDEWIRE GLENOID 2.5X220 (WIRE) IMPLANT
HANDPIECE INTERPULSE COAX TIP (DISPOSABLE) ×2
KIT BASIN OR (CUSTOM PROCEDURE TRAY) ×2 IMPLANT
KIT STABILIZATION SHOULDER (MISCELLANEOUS) ×2 IMPLANT
KIT TURNOVER KIT A (KITS) IMPLANT
MANIFOLD NEPTUNE II (INSTRUMENTS) ×2 IMPLANT
NDL 1/2 CIR CATGUT .05X1.09 (NEEDLE) IMPLANT
NEEDLE 1/2 CIR CATGUT .05X1.09 (NEEDLE) IMPLANT
NS IRRIG 1000ML POUR BTL (IV SOLUTION) ×2 IMPLANT
PACK SHOULDER (CUSTOM PROCEDURE TRAY) ×2 IMPLANT
PAD ARMBOARD 7.5X6 YLW CONV (MISCELLANEOUS) ×4 IMPLANT
PAD COLD SHLDR WRAP-ON (PAD) ×2 IMPLANT
RESTRAINT HEAD UNIVERSAL NS (MISCELLANEOUS) ×2 IMPLANT
RETRIEVER SUT HEWSON (MISCELLANEOUS) IMPLANT
SET HNDPC FAN SPRY TIP SCT (DISPOSABLE) ×2 IMPLANT
SLING ARM FOAM STRAP MED (SOFTGOODS) IMPLANT
SLING ARM FOAM STRAP XLG (SOFTGOODS) IMPLANT
SLING ARM IMMOBILIZER LRG (SOFTGOODS) IMPLANT
SLING ARM IMMOBILIZER MED (SOFTGOODS) IMPLANT
SMARTMIX MINI TOWER (MISCELLANEOUS)
SOLUTION PRONTOSAN WOUND 350ML (IRRIGATION / IRRIGATOR) ×2 IMPLANT
SPONGE T-LAP 4X18 ~~LOC~~+RFID (SPONGE) IMPLANT
SUPPORT WRAP ARM LG (MISCELLANEOUS) ×2 IMPLANT
SUT ETHIBOND 2 V 37 (SUTURE) ×2 IMPLANT
SUT ETHILON 3 0 FSL (SUTURE) ×2 IMPLANT
SUT FIBERWIRE #2 38 T-5 BLUE (SUTURE)
SUT FIBERWIRE #5 38 CONV NDL (SUTURE) ×6
SUT MNCRL AB 4-0 PS2 18 (SUTURE) IMPLANT
SUT VIC AB 0 CT1 27 (SUTURE) ×2
SUT VIC AB 0 CT1 27XBRD ANBCTR (SUTURE) ×2 IMPLANT
SUT VIC AB 3-0 SH 27 (SUTURE) ×2
SUT VIC AB 3-0 SH 27X BRD (SUTURE) ×2 IMPLANT
SUTURE FIBERWR #2 38 T-5 BLUE (SUTURE) IMPLANT
SUTURE FIBERWR #5 38 CONV NDL (SUTURE) IMPLANT
TOWEL OR 17X26 10 PK STRL BLUE (TOWEL DISPOSABLE) ×2 IMPLANT
TOWEL OR NON WOVEN STRL DISP B (DISPOSABLE) ×2 IMPLANT
TOWER SMARTMIX MINI (MISCELLANEOUS) IMPLANT
TUBE SUCTION HIGH CAP CLEAR NV (SUCTIONS) ×2 IMPLANT
WATER STERILE IRR 1000ML POUR (IV SOLUTION) ×2 IMPLANT

## 2022-10-04 NOTE — Anesthesia Preprocedure Evaluation (Signed)
Anesthesia Evaluation  Patient identified by MRN, date of birth, ID band Patient awake    Reviewed: Allergy & Precautions, H&P , NPO status , Patient's Chart, lab work & pertinent test results  Airway Mallampati: II  TM Distance: >3 FB Neck ROM: Full    Dental no notable dental hx.    Pulmonary neg pulmonary ROS, former smoker   Pulmonary exam normal breath sounds clear to auscultation       Cardiovascular negative cardio ROS Normal cardiovascular exam Rhythm:Regular Rate:Normal     Neuro/Psych   Anxiety Depression    Chronic pain    GI/Hepatic negative GI ROS, Neg liver ROS,,,  Endo/Other  negative endocrine ROS    Renal/GU negative Renal ROS  negative genitourinary   Musculoskeletal  (+) Arthritis , Osteoarthritis,    Abdominal   Peds negative pediatric ROS (+)  Hematology negative hematology ROS (+)   Anesthesia Other Findings   Reproductive/Obstetrics negative OB ROS                             Anesthesia Physical Anesthesia Plan  ASA: 3  Anesthesia Plan: General   Post-op Pain Management: Regional block*   Induction: Intravenous  PONV Risk Score and Plan: 3 and Ondansetron, Dexamethasone, Midazolam and Treatment may vary due to age or medical condition  Airway Management Planned: Oral ETT  Additional Equipment:   Intra-op Plan:   Post-operative Plan: Extubation in OR  Informed Consent: I have reviewed the patients History and Physical, chart, labs and discussed the procedure including the risks, benefits and alternatives for the proposed anesthesia with the patient or authorized representative who has indicated his/her understanding and acceptance.     Dental advisory given  Plan Discussed with: CRNA and Surgeon  Anesthesia Plan Comments:        Anesthesia Quick Evaluation

## 2022-10-04 NOTE — Progress Notes (Signed)
Pharmacy Antibiotic Note  Cornell Gaber is a 62 y.o. 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  Pharmacy has been consulted for daptomycin and ceftriaxone dosing.  Plan: Daptomycin 500mg   IV q24h Ceftriaxone 2g IV q24h CK in AM  Height: 5\' 8"  (172.7 cm) Weight: 68.8 kg (151 lb 9.6 oz) IBW/kg (Calculated) : 63.9  Temp (24hrs), Avg:97.9 F (36.6 C), Min:97.8 F (36.6 C), Max:98 F (36.7 C)   Estimated Creatinine Clearance: 74.5 mL/min (by C-G formula based on SCr of 0.63 mg/dL).    Allergies  Allergen Reactions   Diflucan [Fluconazole] Other (See Comments)    Headache    Gabapentin     Pass out    Hydrochlorothiazide Other (See Comments)    Dizzy   Methadone Nausea And Vomiting   Pregabalin Other (See Comments)    Unable to function    Antimicrobials this admission: 6/26 Daptomycin >>  6/26 Ceftriaxone  >>   Dose adjustments this admission: N/a  Microbiology results: 6/26 surgical / deep wound    Thank you for allowing pharmacy to be a part of this patient's care.  Mickeal Skinner 10/04/2022 1:50 PM

## 2022-10-04 NOTE — Plan of Care (Signed)
  Problem: Education: Goal: Knowledge of the prescribed therapeutic regimen will improve Outcome: Progressing Goal: Understanding of activity limitations/precautions following surgery will improve Outcome: Progressing Goal: Individualized Educational Video(s) Outcome: Progressing   Problem: Activity: Goal: Ability to tolerate increased activity will improve Outcome: Progressing   

## 2022-10-04 NOTE — Progress Notes (Signed)
I reviewed the postoperative x-rays and the radiologist mention of the periprosthetic fracture in this case is an accurate.  The patient had complete loss of her integrity of the greater tuberosities and we repaired this back around our robotic spacer stem.    Considering this the tuberosities themselves look quite appropriate as I would expect considering the patient surgical plan.

## 2022-10-04 NOTE — Anesthesia Procedure Notes (Signed)
Procedure Name: Intubation Date/Time: 10/04/2022 11:41 AM  Performed by: Vanessa Bloomsbury, CRNAPre-anesthesia Checklist: Patient identified, Emergency Drugs available, Suction available and Patient being monitored Patient Re-evaluated:Patient Re-evaluated prior to induction Oxygen Delivery Method: Circle system utilized Preoxygenation: Pre-oxygenation with 100% oxygen Induction Type: IV induction Ventilation: Mask ventilation without difficulty Laryngoscope Size: 2 and Miller Grade View: Grade I Tube type: Oral Tube size: 7.0 mm Number of attempts: 1 Airway Equipment and Method: Stylet Placement Confirmation: ETT inserted through vocal cords under direct vision, positive ETCO2 and breath sounds checked- equal and bilateral Secured at: 22 cm Tube secured with: Tape Dental Injury: Teeth and Oropharynx as per pre-operative assessment

## 2022-10-04 NOTE — Discharge Instructions (Addendum)
Ramond Marrow MD, MPH Alfonse Alpers, PA-C [facility] [address] [phone] (tel)   [phone] (fax)   POST-OPERATIVE INSTRUCTIONS - TOTAL SHOULDER REPLACEMENT    WOUND CARE You may leave the operative dressing in place until your follow-up appointment. KEEP THE INCISIONS CLEAN AND DRY. There may be a small amount of fluid/bleeding leaking at the surgical site. This is normal after surgery.  If it fills with liquid or blood please call us immediately to change it for you. Use the provided ice machine or Ice packs as often as possible for the first 3-4 days, then as needed for pain relief.   Keep a layer of cloth or a shirt between your skin and the cooling unit to prevent frost bite as it can get very cold.  SHOWERING: - You may shower on Post-Op Day #2.  - The dressing is water resistant but do not scrub it as it may start to peel up.   - You may remove the sling for showering - Gently pat the area dry.  - Do not soak the shoulder in water.  - Do not go swimming in the pool or ocean until your incision has completely healed (about 4-6 weeks after surgery) - KEEP THE INCISIONS CLEAN AND DRY.  EXERCISES Wear the sling at all times  You may remove the sling for showering, but keep the arm across the chest or in a secondary sling.    Accidental/Purposeful External Rotation and shoulder flexion (reaching behind you) is to be avoided at all costs for the first month. It is ok to come out of your sling if your are sitting and have assistance for eating.   Do not lift anything heavier than 1 pound until we discuss it further in clinic.  It is normal for your fingers/hand to become more swollen after surgery and discolored from bruising.   This will resolve over the first few weeks usually after surgery. Please continue to ambulate and do not stay sitting or lying for too long.  Perform foot and wrist pumps to assist in circulation.  PHYSICAL  THERAPY - No therapy for 4 weeks after surgery  REGIONAL ANESTHESIA (NERVE BLOCKS) The anesthesia team may have performed a nerve block for you this is a great tool used to minimize pain.   The block may start wearing off overnight (between 8-24 hours postop) When the block wears off, your pain may go from nearly zero to the pain you would have had postop without the block. This is an abrupt transition but nothing dangerous is happening.   This can be a challenging period but utilize your as needed pain medications to try and manage this period. We suggest you use the pain medication the first night prior to going to bed, to ease this transition.  You may take an extra dose of narcotic when this happens if needed   POST-OP MEDICATIONS- Multimodal approach to pain control In general your pain will be controlled with a combination of substances.  Prescriptions unless otherwise discussed are electronically sent to your pharmacy.  This is a carefully made plan we use to minimize narcotic use.     Naproxen - Anti-inflammatory medication taken on a scheduled basis Acetaminophen - Non-narcotic pain medicine taken on a scheduled basis  Robaxin - this is a muscle relaxer, take as needed for muscle spasms Aspirin 81mg  - This medicine is used to minimize the risk of blood clots after surgery. Omeprazole - this is  to protect your stomach while taking NSAIDs Zofran -  take as needed for nausea  Other: Oxycodone - This is a strong narcotic, to be used only on an "as needed" basis for SEVERE pain. You will continue oxycodone as prescribed by your pain management provider  FOLLOW-UP If you develop a Fever (>101.5), Redness or Drainage from the surgical incision site, please call our office to arrange for an evaluation. Please call the office to schedule a follow-up appointment for a wound check, 7-10 days post-operatively.  IF YOU HAVE ANY QUESTIONS, PLEASE FEEL FREE TO CALL OUR OFFICE.  HELPFUL  INFORMATION  Your arm will be in a sling following surgery. You will be in this sling for the next 4 weeks.   You may be more comfortable sleeping in a semi-seated position the first few nights following surgery.  Keep a pillow propped under the elbow and forearm for comfort.  If you have a recliner type of chair it might be beneficial.  If not that is fine too, but it would be helpful to sleep propped up with pillows behind your operated shoulder as well under your elbow and forearm.  This will reduce pulling on the suture lines.  When dressing, put your operative arm in the sleeve first.  When getting undressed, take your operative arm out last.  Loose fitting, button-down shirts are recommended.  In most states it is against the law to drive while your arm is in a sling. And certainly against the law to drive while taking narcotics.  You may return to work/school in the next couple of days when you feel up to it. Desk work and typing in the sling is fine.  We suggest you use the pain medication the first night prior to going to bed, in order to ease any pain when the anesthesia wears off. You should avoid taking pain medications on an empty stomach as it will make you nauseous.  You should wean off your narcotic medicines as soon as you are able.     Most patients will be off or using minimal narcotics before their first postop appointment.   Do not drink alcoholic beverages or take illicit drugs when taking pain medications.  Pain medication may make you constipated.  Below are a few solutions to try in this order: Decrease the amount of pain medication if you aren't having pain. Drink lots of decaffeinated fluids. Drink prune juice and/or each dried prunes  If the first 3 don't work start with additional solutions Take Colace - an over-the-counter stool softener Take Senokot - an over-the-counter laxative Take Miralax - a stronger over-the-counter laxative   Dental  Antibiotics:  In most cases prophylactic antibiotics for Dental procdeures after total joint surgery are not necessary.  Exceptions are as follows:  1. History of prior total joint infection  2. Severely immunocompromised (Organ Transplant, cancer chemotherapy, Rheumatoid biologic meds such as Humera)  3. Poorly controlled diabetes (A1C &gt; 8.0, blood glucose over 200)  If you have one of these conditions, contact your surgeon for an antibiotic prescription, prior to your dental procedure.   For more information including helpful videos and documents visit our website:   [email or web address]
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
## 2022-10-04 NOTE — Plan of Care (Signed)
  Problem: Education: Goal: Knowledge of the prescribed therapeutic regimen will improve Outcome: Progressing Goal: Understanding of activity limitations/precautions following surgery will improve Outcome: Progressing   Problem: Pain Management: Goal: Pain level will decrease with appropriate interventions Outcome: Progressing   Problem: Nutrition: Goal: Adequate nutrition will be maintained Outcome: Progressing

## 2022-10-04 NOTE — Anesthesia Procedure Notes (Signed)
Anesthesia Procedure Image    

## 2022-10-04 NOTE — Interval H&P Note (Signed)
All questions answered, patient wants to proceed with procedure. ? ?

## 2022-10-04 NOTE — Op Note (Signed)
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  Upon opening the deep fascia it was clear purulent material and fluid that was abundant.  It was clear that the synovium was reactive and that there was abundant purulent material believed low the implant on the humerus as well as the glenoid.  The glenoid implant was quite loose and there is a cavitary lesion that was bone grafted with allograft bone as it was quite well-contained but large enough that it would have impeded long term fixation of the central post for a reverse total shoulder arthroplasty.  I debated whether to bone graft acutely however even in the setting of infection this bone lesion may have ended up leading to the patient needing a third surgery rather than just a second.  At worst if the graft does not heal then I will have to graft again at the time of her reimplantation.  The humeral stem was well-fixed but there was extreme amounts of proximal bone loss secondary to osteolysis from infection.  The remaining proximal bony aspect of the humerus was quite thin and the tuberosities were essentially a separate section that was repaired to the implant to try and approximate them however I do not expect that this will heal well in the interim until her reimplantation.  This will have to likely be reapproximated to a final implant.  We performed a complete synovectomy of the joint ensure there is no other pockets of infection.  Patient will be seen by infectious disease while she is in the hospital.  She will be sent home on IV antibiotics likely.  For her final implantation we will have to repeat the CT  scan with blueprint protocol to appropriately plan.  I would plan for a +3 baseplate with a center screw ideally depending on her ingrowth of her bone graft.  We will have to use revive implants on the humerus though her humeral size is quite small.  I would have a perform long segment double implant available at that time as well.  Post-operative plan: The patient will be NWB in sling.  The patient will be will be admitted to observation due to medical complexity, monitoring and pain management.  DVT prophylaxis Aspirin 81 mg twice daily for 6 weeks.  Pain control with PRN pain medication preferring oral medicines.  Follow up plan will be scheduled in approximately 7 days for incision check and XR.  Physical therapy to start after 4 weeks.  Implants: Tornier size 2 antibiotic spacer with 52 head  Post-Op Diagnosis: Same Surgeons:Primary: Bjorn Pippin, MD Assistants:Caroline McBane PA-C Location: [facility] ROOM 06 Anesthesia: General with Exparel Interscalene Antibiotics: Ancef 2g preop, Vancomycin 1000mg  locally Tourniquet time: None Estimated Blood Loss: 100 Complications: None Specimens: 3 for culture, hold for 2 weeks to rule out C acnes Implants: Implant Name Type Inv. Item Serial No. Manufacturer Lot No. LRB No. Used Action  BONE CEMENT GENTAMICIN - [id number] Cement BONE CEMENT GENTAMICIN  [employer] [id number] Right 3 Implanted  CUBES CANC 30CC PCANCUBE30 - [id number] Bone Implant CUBES [facility] 30CC PCANCUBE30 [id number] [facility]  Right 1 Implanted    Indications for Surgery:   Dawn Chen is a 61 y.o. female with multiple previous  open shoulder surgeries and clear signs of possible infection though normal labs and signs of cuff failure.  Benefits and risks of operative and nonoperative management were discussed prior to surgery with patient/guardian(s) and informed consent form was completed.  Infection and need for further surgery were discussed as was prosthetic  stability and cuff issues.  We additionally specifically discussed risks of axillary nerve injury, infection, periprosthetic fracture, continued pain and longevity of implants prior to beginning procedure.      Procedure:   The patient was identified in the preoperative holding area where the surgical site was marked. Block placed by anesthesia with exparel.  The patient was taken to the OR where a procedural timeout was called and the above noted anesthesia was induced.  The patient was positioned beachchair on allen table with spider arm positioner.  Preoperative antibiotics were dosed.  The patient's right shoulder was prepped and draped in the usual sterile fashion.  A second preoperative timeout was called.       We began by using the patient's previous deltopectoral incision going through skin sharp achieving hemostasis we progressed.  We identified the cephalic vein in the previous scar and interval between the deltoid and the pectoralis.  We bluntly dissected through this layer identifying the coracoid and staying lateral to the conjoined tendon.  At that point were able to open the remainder of the clavipectoral fascia.  It was clearly under tension with fluid behind it.  Upon opening the clavipectoral fascia there was a E flux of purulent material as well as fluid.  We used a series of blunt retractors to carefully externally rotate and peeled the inferior capsule away from the proximal humerus take care to preserve the pectoralis.  Once we done this we identified that there is significant synovial hypertrophy and signs of infection.  There was significant purulent material below the humeral head implant.  The head implant was removed without issue and we performed a complete synovectomy of the joint and cleared unhealthy appearing synovium excisionally debriding synovium, bone, cartilage and removal of the glenoid implant using a clamp which was quite loose.  We performed a removal of  unhealthy material from behind the glenoid implant until we are down to bleeding bone.  That point we irrigated with 3 L normal saline.  We examined the glenoid and saw that there is a cavitary contained lesion that encompassed the anterior inferior peg as well as the central cortiloc post hole.  We used a curette to ensure that there were down to a bleeding surface.  I performed a synovectomy on the humerus as well at this point I felt it was appropriate to bone graft in the setting of her large amount of bone loss to try and help with our eventual implantation.  We used cancellous cubes that were impacted in place using a bone tamp.  We had good bony fill of this lesion.  At this point we went back to the humerus and used a series of osteotomes to carefully remove the previous short flex stem.  This was removed using a curette to clear the humeral shaft.  We irrigated this with 3 L normal saline and placed Prontosan solution throughout the joint at per instruction.  Multiple specimens were taken.    This point I felt that the tuberosity fragment that was present was healthy enough to try and keep and I placed 3 FiberWire's around it.  We then sized and made a antibiotic hemiarthroplasty implant as above.  Once it was appropriately created we impacted in place and repaired our tuberosity and the remainder of the anterior capsule/subscapularis around it.  I was not able to perform an axillary nerve tug test.  And thus stayed away from this area.  Careful with our dissection and ensure that we stayed away from active is much as possible.  Once the implant was appropriate position we repaired the tuberosity and the anterior capsule using the FiberWire's.  We irrigated again and placed local vancomycin powder.  Incision closed in multilayer fashion finishing with nylon suture.  Sterile dressing and sling were placed.  Patient awoken taken back in stable condition.  Alfonse Alpers, PA-C, present and scrubbed  throughout the case, critical for completion in a timely fashion, and for retraction, instrumentation, closure.

## 2022-10-04 NOTE — Anesthesia Postprocedure Evaluation (Signed)
Anesthesia Post Note  Patient: Dawn Chen  Procedure(s) Performed: RIGHT HEMIARTHROPLASTY WITH ANTIBIOTIC SPACER (Right: Shoulder) SYNOVECTOMY (Right)     Patient location during evaluation: PACU Anesthesia Type: General Level of consciousness: awake and alert Pain management: pain level controlled Vital Signs Assessment: post-procedure vital signs reviewed and stable Respiratory status: spontaneous breathing, nonlabored ventilation, respiratory function stable and patient connected to nasal cannula oxygen Cardiovascular status: blood pressure returned to baseline and stable Postop Assessment: no apparent nausea or vomiting Anesthetic complications: no  No notable events documented.  Last Vitals:  Vitals:   10/04/22 1400 10/04/22 1415  BP: 113/81 104/74  Pulse: 78 86  Resp: 12 (!) 21  Temp:  36.6 C  SpO2: 99% 99%    Last Pain:  Vitals:   10/04/22 1415  TempSrc:   PainSc: 0-No pain                 Keyosha Tiedt S

## 2022-10-04 NOTE — Transfer of Care (Signed)
Immediate Anesthesia Transfer of Care Note  Patient: Dawn Chen  Procedure(s) Performed: RIGHT HEMIARTHROPLASTY WITH ANTIBIOTIC SPACER (Right: Shoulder) SYNOVECTOMY (Right)  Patient Location: PACU  Anesthesia Type:GA combined with regional for post-op pain  Level of Consciousness: awake and patient cooperative  Airway & Oxygen Therapy: Patient Spontanous Breathing and Patient connected to face mask  Post-op Assessment: Report given to RN and Post -op Vital signs reviewed and stable  Post vital signs: Reviewed and stable  Last Vitals:  Vitals Value Taken Time  BP 134/86 10/04/22 1340  Temp    Pulse 81 10/04/22 1343  Resp 17 10/04/22 1343  SpO2 100 % 10/04/22 1343  Vitals shown include unvalidated device data.  Last Pain:  Vitals:   10/04/22 0903  TempSrc:   PainSc: 8       Patients Stated Pain Goal: 5 (10/04/22 0903)  Complications: No notable events documented.

## 2022-10-04 NOTE — Anesthesia Procedure Notes (Addendum)
Anesthesia Regional Block: Interscalene brachial plexus block   Pre-Anesthetic Checklist: , timeout performed,  Correct Patient, Correct Site, Correct Laterality,  Correct Procedure, Correct Position, site marked,  Risks and benefits discussed,  Surgical consent,  Pre-op evaluation,  At surgeon's request and post-op pain management  Laterality: Right  Prep: chloraprep       Needles:  Injection technique: Single-shot  Needle Type: Echogenic Stimulator Needle     Needle Length: 9cm      Additional Needles:   Procedures:,,,, ultrasound used (permanent image in chart),,     Nerve Stimulator or Paresthesia:  Response: 0.45 mA  Additional Responses:   Narrative:  Start time: 10/04/2022 10:45 AM End time: 10/04/2022 10:54 AM Injection made incrementally with aspirations every 5 mL.  Performed by: Personally  Anesthesiologist: Eilene Ghazi, MD  Additional Notes: Patient tolerated the procedure well without complications

## 2022-10-05 ENCOUNTER — Encounter (HOSPITAL_COMMUNITY): Payer: Self-pay | Admitting: Orthopaedic Surgery

## 2022-10-05 ENCOUNTER — Other Ambulatory Visit: Payer: Self-pay

## 2022-10-05 DIAGNOSIS — T8459XA Infection and inflammatory reaction due to other internal joint prosthesis, initial encounter: Secondary | ICD-10-CM

## 2022-10-05 DIAGNOSIS — Z96611 Presence of right artificial shoulder joint: Secondary | ICD-10-CM

## 2022-10-05 LAB — CK: Total CK: 289 U/L — ABNORMAL HIGH (ref 38–234)

## 2022-10-05 MED ORDER — SODIUM CHLORIDE 0.9% FLUSH: 10.0000 mL | Freq: Two times a day (BID) | INTRAVENOUS | Status: DC

## 2022-10-05 MED ORDER — CHLORHEXIDINE GLUCONATE CLOTH 2 % EX PADS
6.0000 | MEDICATED_PAD | Freq: Every day | CUTANEOUS | Status: DC
  Administered 2022-10-06: 6 via TOPICAL

## 2022-10-05 MED ORDER — SODIUM CHLORIDE 0.9% FLUSH: 10.0000 mL | INTRAVENOUS | Status: DC | PRN

## 2022-10-05 NOTE — Plan of Care (Signed)
  Problem: Pain Management: Goal: Pain level will decrease with appropriate interventions Outcome: Progressing   Problem: Activity: Goal: Ability to tolerate increased activity will improve Outcome: Progressing   Problem: Safety: Goal: Ability to remain free from injury will improve Outcome: Progressing   

## 2022-10-05 NOTE — TOC Transition Note (Signed)
Transition of Care [facility]) - CM/SW Discharge Note   Patient Details  Name: Kenslei Hearty MRN: [id number] Date of Birth: 19-Mar-1961  Transition of Care [facility]) CM/SW Contact:  Amada Jupiter, LCSW Phone Number: 10/05/2022, 12:41 PM   Clinical Narrative:     Met with pt to review dc needs.  Pt aware and agreeable with plan to dc home with IV abx coverage - no agency preferences - referral placed with [employer] who will provide abx supplies as well as RN coverage.  No DME needs.  Anticipate dc tomorrow.  Sister to assist as often as needed at dc.  Final next level of care: Home w Home Health Services Barriers to Discharge: No Barriers Identified   Patient Goals and CMS Choice      Discharge Placement                         Discharge Plan and Services Additional resources added to the After Visit Summary for                  DME Arranged: N/A DME Agency: NA       HH Arranged: RN, IV Antibiotics HH Agency: [employer] Date HH Agency Contacted: 10/05/22   Representative spoke with at [facility] Agency: Jeri Modena, RN  Social Determinants of Health (SDOH) Interventions SDOH Screenings   Food Insecurity: No Food Insecurity (10/04/2022)  Housing: Low Risk  (10/04/2022)  Transportation Needs: No Transportation Needs (10/04/2022)  Utilities: Not At Risk (10/04/2022)  Tobacco Use: Medium Risk (10/05/2022)     Readmission Risk Interventions    10/05/2022   12:40 PM  Readmission Risk Prevention Plan  Post Dischage Appt Complete  Medication Screening Complete  Transportation Screening Complete

## 2022-10-05 NOTE — Progress Notes (Signed)
Peripherally Inserted Central Catheter Placement  The IV Nurse has discussed with the patient and/or persons authorized to consent for the patient, the purpose of this procedure and the potential benefits and risks involved with this procedure.  The benefits include less needle sticks, lab draws from the catheter, and the patient may be discharged home with the catheter. Risks include, but not limited to, infection, bleeding, blood clot (thrombus formation), and puncture of an artery; nerve damage and irregular heartbeat and possibility to perform a PICC exchange if needed/ordered by physician.  Alternatives to this procedure were also discussed.  Bard Power PICC patient education guide, fact sheet on infection prevention and patient information card has been provided to patient /or left at bedside.    PICC Placement Documentation  PICC Single Lumen 10/05/22 Left Basilic 44 cm 0 cm (Active)  Indication for Insertion or Continuance of Line Home intravenous therapies (PICC only) 10/05/22 1834  Exposed Catheter (cm) 0 cm 10/05/22 1834  Site Assessment Clean, Dry, Intact 10/05/22 1834  Line Status Flushed;Saline locked;Blood return noted 10/05/22 1834  Dressing Type Transparent;Securing device 10/05/22 1834  Dressing Status Antimicrobial disc in place 10/05/22 1834  Safety Lock Not Applicable 10/05/22 1834  Line Care Connections checked and tightened 10/05/22 1834  Line Adjustment (NICU/IV Team Only) No 10/05/22 1834  Dressing Intervention New dressing 10/05/22 1834  Dressing Change Due 10/12/22 10/05/22 1834       Dawn Chen  Dawn Chen 10/05/2022, 6:35 PM

## 2022-10-05 NOTE — Evaluation (Signed)
Occupational Therapy Evaluation and Discharge Patient Details Name: Dawn Chen MRN: [id number] DOB: 1960-05-21 Today's Date: 10/05/2022   History of Present Illness Pt is a 62 year old woman admitted on 6/26 with R shoulder prosthetic infection. Underwetn R hemiarthroplasty with antibiotic spacer and synovectomy. PMH: 2 previous shoulder surgeries, anxiety, arthritis, carpal tunnel syndrome, chronic pain, depression, sleep apnea.   Clinical Impression   Pt is well versed in shoulder precautions, NWB status, positioning R UE in bed and chair, sling use and compensatory strategies for ADLs with this being her third shoulder surgery. Reinforced all information with pt verbalizing and/or demonstrating understanding. Her sister lives nearby and can offer assist as needed. No further OT needs.      Recommendations for follow up therapy are one component of a multi-disciplinary discharge planning process, led by the attending physician.  Recommendations may be updated based on patient status, additional functional criteria and insurance authorization.   Assistance Recommended at Discharge PRN  Patient can return home with the following Assistance with cooking/housework;Assist for transportation    Functional Status Assessment  Patient has had a recent decline in their functional status and demonstrates the ability to make significant improvements in function in a reasonable and predictable amount of time.  Equipment Recommendations  None recommended by OT    Recommendations for Other Services       Precautions / Restrictions Precautions Precautions: Shoulder Type of Shoulder Precautions: no R shoulder movement, AROM R elbow to hand Shoulder Interventions: Don joy ultra sling Precaution Booklet Issued: Yes (comment) Required Braces or Orthoses: Sling Restrictions Weight Bearing Restrictions: Yes RUE Weight Bearing: Non weight bearing      Mobility Bed Mobility Overal bed mobility:  Modified Independent                  Transfers Overall transfer level: Modified independent                        Balance                                           ADL either performed or assessed with clinical judgement   ADL Overall ADL's : Modified independent                                             Vision Ability to See in Adequate Light: 0 Adequate Patient Visual Report: No change from baseline       Perception     Praxis      Pertinent Vitals/Pain Pain Assessment Pain Assessment: Faces Faces Pain Scale: Hurts little more Pain Location: R shoulder Pain Descriptors / Indicators: Discomfort Pain Intervention(s): Monitored during session, Repositioned, Ice applied     Hand Dominance Right   Extremity/Trunk Assessment Upper Extremity Assessment Upper Extremity Assessment: RUE deficits/detail RUE Deficits / Details: full AROM elbow to hand RUE: Unable to fully assess due to immobilization RUE Coordination: decreased gross motor   Lower Extremity Assessment Lower Extremity Assessment: Overall WFL for tasks assessed   Cervical / Trunk Assessment Cervical / Trunk Assessment: Normal   Communication Communication Communication: No difficulties   Cognition Arousal/Alertness: Awake/alert Behavior During Therapy: WFL for tasks assessed/performed Overall Cognitive Status: Within Functional Limits for tasks  assessed                                       General Comments       Exercises     Shoulder Instructions      Home Living Family/patient expects to be discharged to:: Private residence Living Arrangements: Alone Available Help at Discharge: Family;Available PRN/intermittently Type of Home: Apartment       Home Layout: One level     Bathroom Shower/Tub: Chief Strategy Officer: Standard                Prior Functioning/Environment Prior Level of  Function : Independent/Modified Independent                        OT Problem List:        OT Treatment/Interventions:      OT Goals(Current goals can be found in the care plan section)    OT Frequency:      Co-evaluation              AM-PAC OT "6 Clicks" Daily Activity     Outcome Measure Help from another person eating meals?: None Help from another person taking care of personal grooming?: None Help from another person toileting, which includes using toliet, bedpan, or urinal?: None Help from another person bathing (including washing, rinsing, drying)?: None Help from another person to put on and taking off regular upper body clothing?: None Help from another person to put on and taking off regular lower body clothing?: None 6 Click Score: 24   End of Session    Activity Tolerance: Patient tolerated treatment well Patient left: in bed;with call bell/phone within reach  OT Visit Diagnosis: Pain                Time: 1014-1030 OT Time Calculation (min): 16 min Charges:  OT General Charges $OT Visit: 1 Visit OT Evaluation $OT Eval Low Complexity: 1 Low  Berna Spare, OTR/L Acute Rehabilitation Services Office: [phone]   Evern Bio 10/05/2022, 12:19 PM

## 2022-10-05 NOTE — Progress Notes (Signed)
   ORTHOPAEDIC PROGRESS NOTE  s/p Procedure(s): RIGHT HEMIARTHROPLASTY WITH ANTIBIOTIC SPACER SYNOVECTOMY  SUBJECTIVE: Reports moderate pain about operative site. Nerve block wore off this morning  No chest pain. No SOB. No nausea/vomiting. No other complaints.  OBJECTIVE: PE: General: sitting up in hospital bed, NAD Cardiac: regular rate Pulmonary: no increased work of breathing RUE: Dressing CDI. sling well fitting,  full and painless ROM throughout hand with DPC of 0. Swelling of the right shoulder. + Motor in  AIN, PIN, Ulnar distributions. Axillary nerve sensation preserved and symmetric.  Sensation intact in medial, radial, and ulnar distributions. Well perfused digits.    Vitals:   10/05/22 0607 10/05/22 0929  BP: 104/74 111/80  Pulse: 85 (!) 102  Resp: 17 15  Temp: 98.2 F (36.8 C) 98.2 F (36.8 C)  SpO2: 100% 99%     ASSESSMENT: Dawn Chen is a 62 y.o. female POD#1  PLAN: Weightbearing: NWB RUE Insicional and dressing care: Reinforce dressings as needed Orthopedic device(s):  Sling Showering: post-op day #2 VTE prophylaxis: Aspirin 81mg  BID x 6 weeks Pain control: PRN pain medications, minimize narcotics as able Infected shoulder arthroplasty: Infectious Disease has been consulted. Patient likely to require IV antibiotics. Will place an order for PICC line. Appreciate Infectious Disease input. Dispo: TBD. Needs PICC line, IV antibiotics, Home health set up prior to discharge. She was extremely adamant about discharging home today. We will do what we can to get her discharged as soon as possible.  Follow - up plan: 1 week in office  Contact information:  Weekdays 8-5 Dr. Ramond Marrow, Alfonse Alpers PA-C, After hours and holidays please check [email or web address] for group call information for [facility]   Alfonse Alpers, PA-C 10/05/2022

## 2022-10-06 LAB — AEROBIC/ANAEROBIC CULTURE W GRAM STAIN (SURGICAL/DEEP WOUND)

## 2022-10-06 MED ORDER — NAPROXEN 500 MG PO TABS: 500.0000 mg | ORAL_TABLET | Freq: Two times a day (BID) | ORAL | 0 refills | Status: DC

## 2022-10-06 MED ORDER — DAPTOMYCIN IV (FOR PTA / DISCHARGE USE ONLY): 500.0000 mg | INTRAVENOUS | 0 refills | Status: DC

## 2022-10-06 MED ORDER — ASPIRIN 81 MG PO CHEW: 81.0000 mg | CHEWABLE_TABLET | Freq: Two times a day (BID) | ORAL | 0 refills | Status: DC

## 2022-10-06 MED ORDER — ACETAMINOPHEN 500 MG PO TABS: 1000.0000 mg | ORAL_TABLET | Freq: Three times a day (TID) | ORAL | 0 refills | Status: AC

## 2022-10-06 MED ORDER — METHOCARBAMOL 750 MG PO TABS: 750.0000 mg | ORAL_TABLET | Freq: Three times a day (TID) | ORAL | 0 refills | Status: DC | PRN

## 2022-10-06 MED ORDER — OMEPRAZOLE 20 MG PO CPDR: 20.0000 mg | DELAYED_RELEASE_CAPSULE | Freq: Every day | ORAL | 0 refills | Status: AC

## 2022-10-06 MED ORDER — CEFTRIAXONE IV (FOR PTA / DISCHARGE USE ONLY)
2.0000 g | INTRAVENOUS | 0 refills | Status: DC
Start: 2022-10-06 — End: 2022-10-31

## 2022-10-06 MED ORDER — HEPARIN SOD (PORK) LOCK FLUSH 100 UNIT/ML IV SOLN
250.0000 [IU] | Freq: Once | INTRAVENOUS | Status: AC
  Administered 2022-10-06: 250 [IU]

## 2022-10-06 MED ORDER — ONDANSETRON HCL 4 MG PO TABS: 4.0000 mg | ORAL_TABLET | Freq: Three times a day (TID) | ORAL | 0 refills | Status: AC | PRN

## 2022-10-06 NOTE — Progress Notes (Signed)
[facility] Infectious Diseases Follow Up Note  Patient Identification: Patient Name: Dawn Chen MRN: [id number] Admit Date: 10/04/2022  8:15 AM Age: 62 y.o.Today's Date: 10/06/2022  Reason for Visit: rt shoulder PJI   Principal Problem:   Status post reverse total arthroplasty of right shoulder  Antibiotics:  Vancomycin/daptomycin/ceftriaxone 6/26 Daptomycin/ceftriaxone 6/26-c   Lines/Hardware: rt total knee arthroplasty, rt shoulder arthroplasty     Interval Events: Remains afebrile, status post PICC, no growth in cultures   Assessment 62 year old female with prior history of Lumbar fusion, RT TKA, Gastric bypass, complicated surgical history in her right shoulder with initial right shoulder arthroscopy approximately 10 years ago followed by right anatomic total shoulder replacement on July 30, 2014 with initial noncompliance, and concern for subcapsularis failure in her first postop visit, status post right shoulder subscapularis repair on Aug 18, 2014, lost to follow-up after August 2016, revision anatomic total shoulder arthroplasty with Dr. Griffith Citron with [facility] orthopedics in May 2017 with   # RT shoulder PJI In the setting of prior complicated surgical h/o as below  6/25 status post removal of right total shoulder arthroplasty and synovectomy, right hemiarthroplasty with antibiotic impregnated implant. Purulence noted in OR as well as cavitary lesion in the glenoid that was bone grafted. Multiple OR cx sent. Lab will hold cx for 2 weeks for concerns for P acnes    Recommendations Plan for daptomycin and ceftriaxone for 6 weeks from 6/26 Monitor CBC, CMP, CPK Will monitor cultures peripherally and adjust as needed. No restrictions for discharge  Fu in ID clinic arranged as below   OPAT  Diagnosis: Rt shoulder PJI   Culture Result: NGTD  Allergies  Allergen Reactions   Diflucan [Fluconazole] Other (See  Comments)    Headache    Gabapentin     Pass out    Hydrochlorothiazide Other (See Comments)    Dizzy   Methadone Nausea And Vomiting   Pregabalin Other (See Comments)    Unable to function    OPAT Orders Discharge antibiotics to be given via PICC line Discharge antibiotics: daptomycin and ceftriaxone  Per pharmacy protocol  Duration: 6 weeks  End Date: 11/14/2022  [facility] Care Per Protocol:  Home health RN for IV administration and teaching; PICC line care and labs.    Labs weekly while on IV antibiotics: X__ CBC with differential __ BMP X__ CMP X__ CRP X__ ESR __ Vancomycin trough __ CK  X__ Please pull PIC at completion of IV antibiotics __ Please leave PIC in place until doctor has seen patient or been notified  Fax weekly labs to [phone]  Clinic Follow Up Appt: 7/24 at 4 pm   Rest of the management as per the primary team. Thank you for the consult. Please page with pertinent questions or concerns.  ______________________________________________________________________ Subjective patient seen and examined at the bedside. No concerns, eager to go home.   Vitals BP 124/82 (BP Location: Left Leg)   Pulse (!) 107   Temp 98.2 F (36.8 C) (Oral)   Resp 18   Ht 5\' 8"  (1.727 m)   Wt 68.8 kg   SpO2 100%   BMI 23.05 kg/m     Physical Exam Constitutional:  adult female sitting in the bed and talking on the phone     Comments:   Cardiovascular:     Rate and Rhythm: Normal rate and regular rhythm.     Heart sounds:   Pulmonary:     Effort: Pulmonary effort is normal.  Comments:   Abdominal:     Palpations: Abdomen is non distended     Tenderness:   Musculoskeletal:        General: No swelling or tenderness. Rt shoulder in an arm sling   Skin:    Comments:   Neurological:     General: awake, alert and oriented, following command s  Psychiatric:        Mood and Affect: Mood normal.   Pertinent Microbiology Results for orders placed or  performed during the hospital encounter of 10/04/22  Aerobic/Anaerobic Culture w Gram Stain (surgical/deep wound)     Status: None (Preliminary result)   Collection Time: 10/04/22 12:04 PM   Specimen: Soft Tissue, Other  Result Value Ref Range Status   Specimen Description   Final    TISSUE RIGHT SHOULDER 1 Performed at [facility], [address] [place], [place] [address]    Special Requests   Final    HOLD FOR 2 WKS FOR C.ACNES Performed at [facility], [address], [place], [place] [address]    Gram Stain NO WBC SEEN NO ORGANISMS SEEN   Final   Culture   Final    NO GROWTH < 24 HOURS Performed at [facility], [address], [place], [place] [address]    Report Status PENDING  Incomplete  Aerobic/Anaerobic Culture w Gram Stain (surgical/deep wound)     Status: None (Preliminary result)   Collection Time: 10/04/22 12:07 PM   Specimen: Soft Tissue, Other  Result Value Ref Range Status   Specimen Description   Final    TISSUE RIGHT SHOULDER 2 Performed at [facility], [address], [place], [place] [address]    Special Requests   Final    HOLD FOR 2 WKS FOR C.ACNES Performed at [facility], [address] [place], [place] [address]    Gram Stain NO WBC SEEN NO ORGANISMS SEEN   Final   Culture   Final    NO GROWTH < 24 HOURS Performed at [facility], [address] [place], [place] [address]    Report Status PENDING  Incomplete  Aerobic/Anaerobic Culture w Gram Stain (surgical/deep wound)     Status: None (Preliminary result)   Collection Time: 10/04/22 12:07 PM   Specimen: Soft Tissue, Other  Result Value Ref Range Status   Specimen Description   Final    TISSUE RIGHT SHOULDER 3 Performed at [facility], [address], [place], [place] [address]    Special Requests   Final    HOLD FOR 2 WKS FOR C.ACNES Performed at [facility], [address], [place], [place] [address]    Gram Stain NO WBC SEEN NO ORGANISMS SEEN   Final   Culture   Final    NO GROWTH 2 DAYS Performed at [facility], [address] [place], [place] [address]    Report Status PENDING  Incomplete    Pertinent Lab.    Latest Ref Rng & Units 09/25/2022    8:19 AM 10/04/2014    5:15 AM 08/18/2014   11:16 AM  CBC  WBC 4.0 - 10.5 K/uL 6.4  7.1  7.8   Hemoglobin 12.0 - 15.0 g/dL 24.4  9.2  01.0   Hematocrit 36.0 - 46.0 % 37.6  29.2  30.6   Platelets 150 - 400 K/uL 347  350  529  Latest Ref Rng & Units 09/25/2022    8:19 AM 09/30/2019   10:12 AM 04/29/2019   10:12 AM  CMP  Glucose 70 - 99 mg/dL 87     BUN 8 - 23 mg/dL 14     Creatinine 1.19 - 1.00 mg/dL 1.47     Sodium 829 - 562 mmol/L 139     Potassium 3.5 - 5.1 mmol/L 4.4     Chloride 98 - 111 mmol/L 104     CO2 22 - 32 mmol/L 29     Calcium 8.9 - 10.3 mg/dL 9.5     Total Protein 6.0 - 8.5 g/dL  6.7  7.1   Total Bilirubin 0.0 - 1.2 mg/dL  0.2  <1.3   Alkaline Phos 48 - 121 IU/L  111  111   AST 0 - 40 IU/L  40  27   ALT 0 - 32 IU/L  45  25     Pertinent Imaging today Plain films and CT images have been personally visualized and interpreted; radiology reports have been reviewed. Decision making incorporated into the Impression  [place] EKG SITE RITE  Result Date: 10/05/2022 If Site Rite image not attached, placement could not be confirmed due to current cardiac rhythm.  [place] EKG SITE RITE  Result Date: 10/05/2022 If [facility] image not attached, placement could not be confirmed due to current cardiac rhythm.  DG Shoulder Right Port  Result Date: 10/04/2022 CLINICAL DATA:  Postop right shoulder surgery EXAM: RIGHT SHOULDER - 1 VIEW COMPARISON:  CT 09/07/2022 FINDINGS: Right shoulder arthroplasty, prior operative note this is an antibiotic spacer. There is periprosthetic fracture involving the humeral stem, fracture displacement of 7 mm laterally. This is likely at  site of bone loss on prior CT. Chronic acromioclavicular degenerative change. Os acromial better appreciated on prior CT. Recent postsurgical change includes air and edema in the soft tissues. IMPRESSION: Right shoulder arthroplasty. Periprosthetic fracture involving the humeral stem, fracture displacement of 7 mm. Electronically Signed   By: Narda Rutherford M.D.   On: 10/04/2022 14:57   CT SHOULDER RIGHT WO CONTRAST  Result Date: 09/18/2022 CLINICAL DATA:  Preoperative evaluation for right shoulder surgery. Decreased range of motion. Previous right shoulder surgery. EXAM: CT OF THE UPPER RIGHT EXTREMITY WITHOUT CONTRAST TECHNIQUE: Multidetector CT imaging of the right shoulder was performed according to the standard blueprint protocol. RADIATION DOSE REDUCTION: This exam was performed according to the departmental dose-optimization program which includes automated exposure control, adjustment of the mA and/or kV according to patient size and/or use of iterative reconstruction technique. COMPARISON:  Postoperative radiographs 07/30/2014. Preoperative right shoulder CT 07/20/2014. FINDINGS: Bones/Joint/Cartilage Status post right total shoulder arthroplasty. The humeral component creates moderate beam hardening artifact. There is no evidence of acute fracture or dislocation. There is no gross hardware loosening, although the proximal humeral diaphyseal cortex appears diffusely eroded posteriorly. In addition, there are prominent lucencies within the scapula surrounding the glenoid component, and there are new erosive changes along the undersurface of the acromion. Chronic unfused os acromiale noted. There is a complex shoulder joint effusion with irregular synovial thickening extending laterally into the subdeltoid bursa. Findings are suspicious for particle disease. Ligaments Suboptimally assessed by CT. Muscles and Tendons Moderate atrophy of the subscapularis muscle noted. No other focal muscular  abnormalities are seen. Soft tissues As above, complex right shoulder joint effusion with surrounding bursal fluid collections and osseous erosion suspicious for particle disease. IMPRESSION: 1. Complex right shoulder joint effusion with surrounding bursal fluid  collections and osseous erosion suspicious for particle disease. 2. No acute osseous findings status post total shoulder arthroplasty. Erosive changes around the glenoid component and posterior aspect of the proximal humerus. 3. Chronic atrophy of the subscapularis muscle. Electronically Signed   By: Carey Bullocks M.D.   On: 09/18/2022 14:14    I have personally spent 52  minutes involved in face-to-face and non-face-to-face activities for this patient on the day of the visit. Professional time spent includes the following activities: Preparing to see the patient (review of tests), Obtaining and/or reviewing separately obtained history (admission/discharge record), Performing a medically appropriate examination and/or evaluation , Ordering medications/tests/procedures, referring and communicating with other health care professionals, Documenting clinical information in the EMR, Independently interpreting results (not separately reported), Communicating results to the patient/family/caregiver, Counseling and educating the patient/family/caregiver and Care coordination (not separately reported).   Plan d/w requesting provider as well as ID pharm D  Note: This document was prepared using dragon voice recognition software and may include unintentional dictation errors.   Electronically signed by:   Odette Fraction, MD Infectious Disease Physician [facility] Pager: [phone]

## 2022-10-06 NOTE — Plan of Care (Signed)
  Problem: Education: Goal: Knowledge of the prescribed therapeutic regimen will improve Outcome: Progressing   Problem: Activity: Goal: Ability to tolerate increased activity will improve Outcome: Progressing   Problem: Pain Management: Goal: Pain level will decrease with appropriate interventions Outcome: Progressing   

## 2022-10-06 NOTE — Progress Notes (Signed)
   ORTHOPAEDIC PROGRESS NOTE  s/p Procedure(s): RIGHT HEMIARTHROPLASTY WITH ANTIBIOTIC SPACER SYNOVECTOMY  SUBJECTIVE: Reports moderate pain about operative site. She had pain in her right shoulder when trying to get out of bed this morning. PICC line was placed last night. ID team wanted to wait 24 hours for cultures. Patient ready to discharge today.    No chest pain. No SOB. No nausea/vomiting. No other complaints.  OBJECTIVE: PE: General: sitting up in hospital bed, NAD Cardiac: regular rate Pulmonary: no increased work of breathing RUE: sling well fitting,  full and painless ROM throughout hand with DPC of 0. Swelling of the right shoulder. Aquacel with moderate amount of bleeding noted. Aquacel removed. No active drainage. Incision CDI. New Aquacel placed. + Motor in  AIN, PIN, Ulnar distributions. Axillary nerve sensation preserved but she states feels different from contralateral side.  Sensation intact in medial, radial, and ulnar distributions. Well perfused digits.    Vitals:   10/06/22 0354 10/06/22 0410  BP: 111/65   Pulse: (!) 114 96  Resp: 18   Temp: 98.3 F (36.8 C)   SpO2: 96%    Imaging: reviewed the postoperative x-rays and the radiologist mention of the periprosthetic fracture in this case is an accurate. The patient had complete loss of her integrity of the greater tuberosities and we repaired this back around our robotic spacer stem.   ASSESSMENT: Dawn Chen is a 62 y.o. female POD#2  PLAN: Weightbearing: NWB RUE Insicional and dressing care: Reinforce dressings as needed Orthopedic device(s):  Sling Showering: post-op day #2 VTE prophylaxis: Aspirin 81mg  BID x 6 weeks Pain control: PRN pain medications, minimize narcotics as able Infected shoulder arthroplasty: Infectious Disease has been consulted. PICC line has been placed. Appreciate Infectious Disease input. Dispo: Plan to discharge today once antibiotic recommendations are in. Patient's sister  can pick her up before 11 am. So hopeful for discharge prior to this.  Follow - up plan: 1 week in office  Contact information:  Weekdays 8-5 Dr. Ramond Marrow, Alfonse Alpers PA-C, After hours and holidays please check [email or web address] for group call information for [facility]   Alfonse Alpers, PA-C 10/06/2022

## 2022-10-06 NOTE — Progress Notes (Signed)
PHARMACY CONSULT NOTE FOR:  OUTPATIENT  PARENTERAL ANTIBIOTIC THERAPY (OPAT)  Indication: Right shoulder PJI Regimen: daptomycin 500mg  IV q24h and ceftriaxone 2g IV q24h End date: 11/14/2022  IV antibiotic discharge orders are pended. To discharging provider:  please sign these orders via discharge navigator,  Select New Orders & click on the button choice - Manage This Unsigned Work.     Thank you for allowing pharmacy to be a part of this patient's care.  Dawn Chen 10/06/2022, 8:50 AM

## 2022-10-06 NOTE — Progress Notes (Signed)
Provided discharge education/instructions, all questions and concerns addressed. IV team capped PICC line to go home.Pt is not in acute distress, discharged home with all of her belongings accompanied by her sister.

## 2022-10-08 LAB — AEROBIC/ANAEROBIC CULTURE W GRAM STAIN (SURGICAL/DEEP WOUND)

## 2022-10-09 LAB — AEROBIC/ANAEROBIC CULTURE W GRAM STAIN (SURGICAL/DEEP WOUND): Gram Stain: NONE SEEN

## 2022-10-10 LAB — AEROBIC/ANAEROBIC CULTURE W GRAM STAIN (SURGICAL/DEEP WOUND)

## 2022-10-10 NOTE — Discharge Summary (Signed)
Patient ID: Dawn Chen MRN: [id number] DOB/AGE: 1960-12-14 62 y.o.  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   Discharge Diagnoses:  Principal Problem:   Status post reverse total arthroplasty of right shoulder   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     Procedures Performed: Right removal of total shoulder arthroplasty and synovectomy Right hemiarthroplasty with antibiotic impregnated implant  Discharged Condition: stable  Hospital Course: Patient brought in for scheduled procedure. She  tolerated procedure well.  She was kept for monitoring overnight for pain control, medical monitoring postop, Infectious Disease consultation, PICC line placement, and OT evaluation. She was found to be stable for DC home the morning after surgery.  Patient was instructed on specific activity restrictions and all questions were answered.  Consults: Infectious Disease  Significant Diagnostic Studies: Cultures  Treatments: Surgery  Discharge Exam: General: sitting up in hospital bed, NAD Cardiac: regular rate Pulmonary: no increased work of breathing RUE: sling well fitting,  full and painless ROM throughout hand with DPC of 0. Swelling of the right shoulder. Aquacel with moderate amount of bleeding noted. Aquacel removed. No active drainage. Incision CDI. New Aquacel placed. + Motor in  AIN, PIN, Ulnar distributions. Axillary nerve sensation preserved but she states feels different from contralateral side.  Sensation intact in medial, radial, and ulnar distributions. Well perfused digits.   Disposition: Discharge disposition: 01-Home or Self  Care       Discharge Instructions     [facility] pharmacist to adjust dose for Vancomycin, Aminoglycosides and other anti-infective therapies as requested by physician.   Complete by: As directed    [facility] to provide Cath Flo 2mg    Complete by: As directed    Administer for PICC line occlusion and as ordered by physician for other access device issues.   Anaphylaxis Kit: Provided to treat any anaphylactic reaction to the medication being provided to the patient if First Dose or when requested by physician   Complete by: As directed    Epinephrine 1mg /ml vial / amp: Administer 0.3mg  (0.40ml) subcutaneously once for moderate to severe anaphylaxis, nurse to call physician and pharmacy when reaction occurs and call 911 if needed for immediate care   Diphenhydramine 50mg /ml IV vial: Administer 25-50mg  IV/IM PRN for first dose reaction, rash, itching, mild reaction, nurse to call physician and pharmacy when reaction occurs   Sodium Chloride 0.9% NS IV: Administer if needed for hypovolemic blood pressure drop or as ordered by physician after call to physician with anaphylactic reaction   Call MD for:  redness, tenderness, or signs of infection (pain, swelling, redness, odor or green/yellow discharge around incision site)   Complete by: As directed    Call MD for:  severe uncontrolled pain   Complete by: As directed    Call MD for:  temperature >100.4   Complete by: As directed    Change dressing on IV access line weekly and PRN   Complete by: As directed    Diet - low sodium heart healthy   Complete by: As directed    Flush IV access with  Sodium Chloride 0.9% and Heparin 10 units/ml or 100 units/ml   Complete by: As directed    Home infusion instructions - [facility]   Complete by: As directed    Instructions: Flush IV access with Sodium Chloride 0.9% and Heparin 10units/ml or 100units/ml   Change dressing on IV access line: Weekly and PRN    Instructions Cath Flo 2mg : Administer for PICC Line occlusion and as ordered by physician for other access device   [facility] pharmacist to adjust dose for: Vancomycin, Aminoglycosides and other anti-infective therapies as requested by physician   Method of administration may be changed at the discretion of home infusion pharmacist based upon assessment of the patient and/or caregiver's ability to self-administer the medication ordered   Complete by: As directed    Outpatient Parenteral Antibiotic Therapy Information Antibiotic: Ceftriaxone (Rocephin) IVPB, Daptomycin (Cubicin) IVPB; Indications for use: right shoulder PJI; End Date: 11/14/2022   Complete by: As directed    Antibiotic:  Ceftriaxone (Rocephin) IVPB Daptomycin (Cubicin) IVPB     Indications for use: right shoulder PJI   End Date: 11/14/2022      Allergies as of 10/06/2022       Reactions   Diflucan [fluconazole] Other (See Comments)   Headache   Gabapentin    Pass out    Hydrochlorothiazide Other (See Comments)   Dizzy   Methadone Nausea And Vomiting   Pregabalin Other (See Comments)   Unable to function        Medication List     STOP taking these medications    cyclobenzaprine 10 MG tablet Commonly known as: FLEXERIL   ibuprofen 800 MG tablet Commonly known as: ADVIL       TAKE these medications    acetaminophen 500 MG tablet Commonly known as: TYLENOL Take 2 tablets (1,000 mg total) by mouth every 8 (eight) hours for 14 days. What changed:  when to take this reasons to take this Notes to patient: Last dose given 06/27 11:41am   Amitiza 24 MCG capsule Generic drug: lubiprostone Take 24 mcg by mouth 2 (two) times daily. Notes to patient: Resume home regimen   aspirin 81 MG chewable tablet Commonly known as: Aspirin Childrens Chew 1 tablet (81 mg total) by mouth 2 (two) times daily. For 6 weeks for DVT prophylaxis after surgery   cefTRIAXone  IVPB Commonly known as:  ROCEPHIN Inject 2 g into the vein daily. Indication:  right shoulder PJI First Dose: No Last Day of Therapy:  11/14/2022 Labs - Once weekly:  CBC/D and BMP, Labs - Once weekly: ESR and CRP Method of administration: IV Push Method of administration may be changed at the discretion of home infusion pharmacist based upon assessment of the patient and/or caregiver's ability to self-administer the medication ordered. Notes to patient: Last dose given 06/28 08:49am   chlorhexidine 0.12 % solution Commonly known as: PERIDEX Use as directed 15 mLs in the mouth or throat daily as needed (mouth pain).   daptomycin  IVPB Commonly known as: CUBICIN Inject 500 mg into the vein daily. Indication: right shoulder PJI First Dose: No Last Day of Therapy:  11/14/2022 Labs - Once weekly:  CBC/D, BMP, and CPK Labs - Once weekly: ESR and CRP Method of administration: IV Push Method of administration may be changed at the discretion of home infusion pharmacist based upon assessment of the patient and/or caregiver's ability to self-administer the medication ordered. Notes to patient: Last dose given 06/28 09:31am   ferrous sulfate  325 (65 FE) MG tablet Take 325 mg by mouth daily. Notes to patient: Resume home regimen   MAG GLYCINATE PO Take 2 tablets by mouth 2 (two) times daily. Notes to patient: Resume home regimen   methocarbamol 750 MG tablet Commonly known as: Robaxin-750 Take 1 tablet (750 mg total) by mouth every 8 (eight) hours as needed for muscle spasms. Notes to patient: Last dose given 06/28 03:55am   naproxen 500 MG tablet Commonly known as: NAPROSYN Take 1 tablet (500 mg total) by mouth 2 (two) times daily with a meal.   omeprazole 20 MG capsule Commonly known as: PRILOSEC Take 1 capsule (20 mg total) by mouth daily. To gastric protection while taking NSAIDs   omeprazole 20 MG tablet Commonly known as: PRILOSEC OTC Take 20 mg by mouth daily as needed (acid reflux).   ondansetron 4  MG tablet Commonly known as: Zofran Take 1 tablet (4 mg total) by mouth every 8 (eight) hours as needed for up to 7 days for nausea or vomiting. Notes to patient: St dose given 06/26 11:41am   oxycodone 30 MG immediate release tablet Commonly known as: ROXICODONE Take 30 mg by mouth 5 (five) times daily as needed for pain. Notes to patient: Last dose given 06/28 06:31am   Premarin vaginal cream Generic drug: conjugated estrogens Place 1 applicator vaginally daily as needed (dryness/irritation). Notes to patient: Resume home regimen   terbinafine 250 MG tablet Commonly known as: LamISIL Take 1 tablet (250 mg total) by mouth daily. Notes to patient: Resume home regimen   traZODone 100 MG tablet Commonly known as: DESYREL Take 100 mg by mouth at bedtime as needed for sleep. Notes to patient: Last dose given 06/27 08:54pm   triamcinolone cream 0.1 % Commonly known as: KENALOG Apply 1 Application topically daily as needed (rash). Notes to patient: Resume home regimen   Vitamin D (Ergocalciferol) 1.25 MG (50000 UNIT) Caps capsule Commonly known as: DRISDOL Take 50,000 Units by mouth every 7 (seven) days. Notes to patient: Resume home regimen   Vitamin D3 250 MCG (10000 UT) capsule Take 10,000 Units by mouth daily. Notes to patient: Resume home regimen               Discharge Care Instructions  (From admission, onward)           Start     Ordered   10/06/22 0000  Change dressing on IV access line weekly and PRN  (Home infusion instructions - [facility] )        10/06/22 0849            Alfonse Alpers, PA-C

## 2022-10-13 LAB — AEROBIC/ANAEROBIC CULTURE W GRAM STAIN (SURGICAL/DEEP WOUND): Gram Stain: NONE SEEN

## 2022-10-15 LAB — AEROBIC/ANAEROBIC CULTURE W GRAM STAIN (SURGICAL/DEEP WOUND): Gram Stain: NONE SEEN

## 2022-10-16 LAB — AEROBIC/ANAEROBIC CULTURE W GRAM STAIN (SURGICAL/DEEP WOUND): Culture: NO GROWTH

## 2022-10-17 ENCOUNTER — Telehealth: Payer: Self-pay | Admitting: Pharmacist

## 2022-10-17 ENCOUNTER — Encounter: Payer: Self-pay | Admitting: Infectious Diseases

## 2022-10-17 NOTE — Telephone Encounter (Addendum)
Per Dr. Elinor Parkinson, will STOP daptomycin and ceftriaxone and START vancomycin given patient's cultures came back with Corynebacterium JK group which could have resistance concerns with daptomycin. Also will need twice weekly vancomycin trough and CMP. Routed message to Jeri Modena and [facility] team about this change.  Margarite Gouge, PharmD, CPP, BCIDP, AAHIVP Clinical Pharmacist Practitioner Infectious Diseases Clinical Pharmacist [facility] for Infectious Disease

## 2022-10-17 NOTE — Progress Notes (Signed)
Results for orders placed or performed during the hospital encounter of 10/04/22  Aerobic/Anaerobic Culture w Gram Stain (surgical/deep wound)     Status: None (Preliminary result)   Collection Time: 10/04/22 12:04 PM   Specimen: Soft Tissue, Other  Result Value Ref Range Status   Specimen Description   Final    TISSUE RIGHT SHOULDER 1 Performed at [facility], [address], [place], [place] [address]    Special Requests   Final    HOLD FOR 2 WKS FOR C.ACNES Performed at [facility], [address], [place], [place] [address]    Gram Stain NO WBC SEEN NO ORGANISMS SEEN   Final   Culture   Final    NO GROWTH 13 DAYS CONTINUING TO HOLD Performed at [facility], [address] [place], [place] [address]    Report Status PENDING  Incomplete  Aerobic/Anaerobic Culture w Gram Stain (surgical/deep wound)     Status: None (Preliminary result)   Collection Time: 10/04/22 12:07 PM   Specimen: Soft Tissue, Other  Result Value Ref Range Status   Specimen Description   Final    TISSUE RIGHT SHOULDER 2 Performed at [facility], [address], [place], [place] [address]    Special Requests   Final    HOLD FOR 2 WKS FOR C.ACNES Performed at [facility], [address] [place], [place] [address]    Gram Stain NO WBC SEEN NO ORGANISMS SEEN   Final   Culture   Final    RARE CORYNEBACTERIUM, GROUP JK Standardized susceptibility testing for this organism is not available. CONTINUING TO HOLD Performed at [facility], [address] [place], [place] [address]    Report Status PENDING  Incomplete  Aerobic/Anaerobic Culture w Gram Stain (surgical/deep wound)     Status: None (Preliminary result)   Collection Time: 10/04/22 12:07 PM   Specimen: Soft Tissue, Other  Result Value Ref Range Status   Specimen Description   Final    TISSUE RIGHT SHOULDER 3 Performed at [facility],  [address] [place], [place] [address]    Special Requests   Final    HOLD FOR 2 WKS FOR C.ACNES Performed at [facility], [address], [place], [place] [address]    Gram Stain NO WBC SEEN NO ORGANISMS SEEN   Final   Culture   Final    NO GROWTH 13 DAYS CONTINUING TO HOLD Performed at [facility], [address] [place], [place] [address]    Report Status PENDING  Incomplete    Will switch IV abtx to Vancomycin only, pharmacy to dose. Pharmacy staff Margarite Gouge has informed Home health about the change  Updated OPAT   Diagnosis: Rt shoulder PJI   Culture Result: Corynebacterium JK  Allergies  Allergen Reactions   Diflucan [Fluconazole] Other (See Comments)    Headache    Gabapentin     Pass out    Hydrochlorothiazide Other (See Comments)    Dizzy   Methadone Nausea And Vomiting   Pregabalin Other (See Comments)    Unable to function    OPAT Orders Discharge antibiotics to be given via PICC line Discharge antibiotics: Vancomycin, pharmacy to dose Per pharmacy protocol  Aim for Vancomycin trough 15-20 or AUC 400-550 (unless otherwise indicated) Duration: 6 weeks from start date   [facility] Care Per Protocol:  Home health RN for IV administration and teaching;  PICC line care and labs.    Labs weekly while on IV antibiotics: X__ CBC with differential X__ BMP X__ CMP X__ CRP __ ESR X__ Vancomycin trough twice a week __ CK  __ Please pull PIC at completion of IV antibiotics X__ Please leave PIC in place until doctor has seen patient or been notified  Fax weekly labs to [phone]  Clinic Follow Up Appt: 7/24 at 4pm

## 2022-10-18 LAB — AEROBIC/ANAEROBIC CULTURE W GRAM STAIN (SURGICAL/DEEP WOUND)

## 2022-10-18 NOTE — Progress Notes (Signed)
Sent message, via epic in basket, requesting order in epic from surgeon  

## 2022-10-20 ENCOUNTER — Other Ambulatory Visit (HOSPITAL_COMMUNITY): Payer: Medicare Other

## 2022-10-24 NOTE — Patient Instructions (Signed)
SURGICAL WAITING ROOM VISITATION Patients having surgery or a procedure may have no more than 2 support people in the waiting area - these visitors may rotate in the visitor waiting room.   Due to an increase in RSV and influenza rates and associated hospitalizations, children ages 56 and under may not visit patients in [facility] hospitals. If the patient needs to stay at the hospital during part of their recovery, the visitor guidelines for inpatient rooms apply.  PRE-OP VISITATION  Pre-op nurse will coordinate an appropriate time for 1 support person to accompany the patient in pre-op.  This support person may not rotate.  This visitor will be contacted when the time is appropriate for the visitor to come back in the pre-op area.  Please refer to the [facility] website for the visitor guidelines for Inpatients (after your surgery is over and you are in a regular room).  You are not required to quarantine at this time prior to your surgery. However, you must do this: Hand Hygiene often Do NOT share personal items Notify your provider if you are in close contact with someone who has COVID or you develop fever 100.4 or greater, new onset of sneezing, cough, sore throat, shortness of breath or body aches.  If you test positive for Covid or have been in contact with anyone that has tested positive in the last 10 days please notify you surgeon.    Your procedure is scheduled on:  Wednesday  November 01, 2022  Report to [facility] Main Entrance: [facility] entrance where the [employer] is available.   Report to admitting at:  05:15   AM  Call this number if you have any questions or problems the morning of surgery [phone]  DO NOT EAT OR DRINK ANYTHING AFTER MIDNIGHT THE NIGHT PRIOR TO YOUR SURGERY / PROCEDURE.   FOLLOW  ANY ADDITIONAL PRE OP INSTRUCTIONS YOU RECEIVED FROM YOUR SURGEON'S OFFICE!!!   Oral Hygiene is also important to reduce your risk of infection.         Remember - BRUSH YOUR TEETH THE MORNING OF SURGERY WITH YOUR REGULAR TOOTHPASTE  Do NOT smoke after Midnight the night before surgery.  Take ONLY these medicines the morning of surgery with A SIP OF WATER: Omeprazole (Prilosec)                   You may not have any metal on your body including hair pins, jewelry, and body piercing  Do not wear make-up, lotions, powders, perfumes  or deodorant  Do not wear nail polish including gel and S&S, artificial / acrylic nails, or any other type of covering on natural nails including finger and toenails. If you have artificial nails, gel coating, etc., that needs to be removed by a nail salon, Please have this removed prior to surgery. Not doing so may mean that your surgery could be cancelled or delayed if the Surgeon or anesthesia staff feels like they are unable to monitor you safely.   Do not shave 48 hours prior to surgery to avoid nicks in your skin which may contribute to postoperative infections.    Contacts, Hearing Aids, dentures or bridgework may not be worn into surgery. DENTURES WILL BE REMOVED PRIOR TO SURGERY PLEASE DO NOT APPLY "Poly grip" OR ADHESIVES!!!  You may bring a small overnight bag with you on the day of surgery, only pack items that are not valuable. [facility] IS NOT RESPONSIBLE   FOR VALUABLES THAT  ARE LOST OR STOLEN.   Do not bring your home medications to the hospital. The Pharmacy will dispense medications listed on your medication list to you during your admission in the Hospital.  Special Instructions: Bring a copy of your healthcare power of attorney and living will documents the day of surgery, if you wish to have them scanned into your [facility] Records- EPIC  Please read over the following fact sheets you were given: IF YOU HAVE QUESTIONS ABOUT YOUR PRE-OP INSTRUCTIONS, PLEASE CALL [phone].   y of your healthcare power of attorney and living will documents the day of surgery, if you wish to  have them scanned into your [facility] Records- EPIC  Please read over the following fact sheets you were given: IF YOU HAVE QUESTIONS ABOUT YOUR PRE-OP INSTRUCTIONS, PLEASE CALL [phone].     Pre-operative 5 CHG Bath Instructions   You can play a key role in reducing the risk of infection after surgery. Your skin needs to be as free of germs as possible. You can reduce the number of germs on your skin by washing with CHG (chlorhexidine gluconate) soap before surgery. CHG is an antiseptic soap that kills germs and continues to kill germs even after washing.   DO NOT use if you have an allergy to chlorhexidine/CHG or antibacterial soaps. If your skin becomes reddened or irritated, stop using the CHG and notify one of our RNs at [phone]  Please shower with the CHG soap starting 4 days before surgery using the following schedule: START SHOWERS ON  SATURDAY   October 28, 2022                                                                                                                                                                                      Please keep in mind the following:  DO NOT shave, including legs and underarms, starting the day of your first shower.   You may shave your face at any point before/day of surgery.   Place clean sheets on your bed the day you start using CHG soap. Use a clean washcloth (not used since being washed) for each shower. DO NOT sleep with pets once you start using the CHG.   CHG Shower Instructions:  If you choose to wash your hair and private area, wash first with your normal shampoo/soap.  After you use shampoo/soap, rinse your hair and body thoroughly to remove shampoo/soap residue.  Turn the water OFF and apply about 3 tablespoons (45 ml) of CHG soap to a CLEAN washcloth.  Apply CHG soap ONLY FROM YOUR NECK DOWN TO YOUR TOES (washing for 3-5 minutes)  DO NOT  use CHG soap on face, private areas, open wounds, or sores.  Pay  special attention to the area where your surgery is being performed.  If you are having back surgery, having someone wash your back for you may be helpful.  Wait 2 minutes after CHG soap is applied, then you may rinse off the CHG soap.  Pat dry with a clean towel  Put on clean clothes/pajamas   If you choose to wear lotion, please use ONLY the CHG-compatible lotions on the back of this paper.     Additional instructions for the day of surgery: DO NOT APPLY any lotions, deodorants, cologne, or perfumes.   Put on clean/comfortable clothes.  Brush your teeth.  Ask your nurse before applying any prescription medications to the skin.      CHG Compatible Lotions   Aveeno Moisturizing lotion  Cetaphil Moisturizing Cream  Cetaphil Moisturizing Lotion  Clairol Herbal Essence Moisturizing Lotion, Dry Skin  Clairol Herbal Essence Moisturizing Lotion, Extra Dry Skin  Clairol Herbal Essence Moisturizing Lotion, Normal Skin  Curel Age Defying Therapeutic Moisturizing Lotion with Alpha Hydroxy  Curel Extreme Care Body Lotion  Curel Soothing Hands Moisturizing Hand Lotion  Curel Therapeutic Moisturizing Cream, Fragrance-Free  Curel Therapeutic Moisturizing Lotion, Fragrance-Free  Curel Therapeutic Moisturizing Lotion, Original Formula  Eucerin Daily Replenishing Lotion  Eucerin Dry Skin Therapy Plus Alpha Hydroxy Crme  Eucerin Dry Skin Therapy Plus Alpha Hydroxy Lotion  Eucerin Original Crme  Eucerin Original Lotion  Eucerin Plus Crme Eucerin Plus Lotion  Eucerin TriLipid Replenishing Lotion  Keri Anti-Bacterial Hand Lotion  Keri Deep Conditioning Original Lotion Dry Skin Formula Softly Scented  Keri Deep Conditioning Original Lotion, Fragrance Free Sensitive Skin Formula  Keri Lotion Fast Absorbing Fragrance Free Sensitive Skin Formula  Keri Lotion Fast Absorbing Softly Scented Dry Skin Formula  Keri Original Lotion  Keri Skin Renewal Lotion Keri Silky Smooth Lotion  Keri Silky  Smooth Sensitive Skin Lotion  Nivea Body Creamy Conditioning Oil  Nivea Body Extra Enriched Lotion  Nivea Body Original Lotion  Nivea Body Sheer Moisturizing Lotion Nivea Crme  Nivea Skin Firming Lotion  NutraDerm 30 Skin Lotion  NutraDerm Skin Lotion  NutraDerm Therapeutic Skin Cream  NutraDerm Therapeutic Skin Lotion  ProShield Protective Hand Cream  Provon moisturizing lotion      Preparing for Total Shoulder Arthroplasty ================================================================= Please follow these instructions carefully, in addition to any other special Bathing information that was explained to you at the Presurgical Appointment:  BENZOYL PEROXIDE 5% GEL: Used to kill bacteria on the skin which could cause an infection at the surgery site.   Please do not use if you have an allergy to benzoyl peroxide. If your skin becomes reddened/irritated stop using the benzoyl peroxide and inform your Doctor.   Starting two days before surgery, apply as follows:  1. Apply benzoyl peroxide gel in the morning and at night. Apply after taking a shower. If you are not taking a shower, clean entire shoulder front, back, and side, along with the armpit with a clean wet washcloth.  2. Place a quarter-sized dollop of the gel on your SHOULDER and rub in thoroughly, making sure to cover the front, back, and side of your shoulder, along with the armpit.   2 Days prior to Surgery          Monday  10-30-2022 First Application _______ Morning Second Application _______ Night  Day Before Surgery         Tuesday  10-31-2022 First Application______ Morning  On the night before surgery, wash your entire body (except hair, face and private areas) with CHG Soap. THEN, rub in the LAST application of the Benzoyl Peroxide Gel on your shoulder.   3. On the Morning of Surgery wash your BODY AGAIN with CHG Soap (except hair, face and private areas)  4. DO NOT USE THE BENZOYL PEROXIDE GEL ON THE DAY OF  YOUR SURGERY     FAILURE TO FOLLOW THESE INSTRUCTIONS MAY RESULT IN THE CANCELLATION OF YOUR SURGERY  PATIENT SIGNATURE_________________________________  NURSE SIGNATURE__________________________________  ________________________________________________________________________         Dawn Chen    An incentive spirometer is a tool that can help keep your lungs clear and active. This tool measures how well you are filling your lungs with each breath. Taking long deep breaths may help reverse or decrease the chance of developing breathing (pulmonary) problems (especially infection) following: A long period of time when you are unable to move or be active. BEFORE THE PROCEDURE  If the spirometer includes an indicator to show your best effort, your nurse or respiratory therapist will set it to a desired goal. If possible, sit up straight or lean slightly forward. Try not to slouch. Hold the incentive spirometer in an upright position. INSTRUCTIONS FOR USE  Sit on the edge of your bed if possible, or sit up as far as you can in bed or on a chair. Hold the incentive spirometer in an upright position. Breathe out normally. Place the mouthpiece in your mouth and seal your lips tightly around it. Breathe in slowly and as deeply as possible, raising the piston or the ball toward the top of the column. Hold your breath for 3-5 seconds or for as long as possible. Allow the piston or ball to fall to the bottom of the column. Remove the mouthpiece from your mouth and breathe out normally. Rest for a few seconds and repeat Steps 1 through 7 at least 10 times every 1-2 hours when you are awake. Take your time and take a few normal breaths between deep breaths. The spirometer may include an indicator to show your best effort. Use the indicator as a goal to work toward during each repetition. After each set of 10 deep breaths, practice coughing to be sure your lungs are clear. If you  have an incision (the cut made at the time of surgery), support your incision when coughing by placing a pillow or rolled up towels firmly against it. Once you are able to get out of bed, walk around indoors and cough well. You may stop using the incentive spirometer when instructed by your caregiver.  RISKS AND COMPLICATIONS Take your time so you do not get dizzy or light-headed. If you are in pain, you may need to take or ask for pain medication before doing incentive spirometry. It is harder to take a deep breath if you are having pain. AFTER USE Rest and breathe slowly and easily. It can be helpful to keep track of a log of your progress. Your caregiver can provide you with a simple table to help with this. If you are using the spirometer at home, follow these instructions: SEEK MEDICAL CARE IF:  You are having difficultly using the spirometer. You have trouble using the spirometer as often as instructed. Your pain medication is not giving enough relief while using the spirometer. You develop fever of 100.5 F (38.1 C) or higher.  SEEK IMMEDIATE MEDICAL CARE IF:  You cough up bloody sputum that had not been present before. You develop fever of 102 F (38.9 C) or greater. You develop worsening pain at or near the incision site. MAKE SURE YOU:  Understand these instructions. Will watch your condition. Will get help right away if you are not doing well or get worse. Document Released: 08/07/2006 Document Revised: 06/19/2011 Document Reviewed: 10/08/2006 [facility] Patient Information 2014 [place], [place].

## 2022-10-24 NOTE — Progress Notes (Signed)
COVID Vaccine received:  [x]  No []  Yes Date of any COVID positive Test in last 90 days:  none  PCP - Eustaquio Boyden, MD  at [facility] [phone] Cardiologist - none Pain Mgmt- Dr. Modesto Charon at [facility] c[facility]  Chest x-ray -    07-27-2014 2v  Epic EKG -  09-25-2022  Epic Stress Test -  ECHO -  Cardiac Cath -   PCR screen: [x]  Ordered & Completed           []   No Order but Needs PROFEND           []   N/A for this surgery  Surgery Plan:  []  Ambulatory                            []  Outpatient in bed                            [x]  Admit  Anesthesia:    [x]  General  []  Spinal                           []   Choice []   MAC  Pacemaker / ICD device [x]  No []  Yes   Spinal Cord Stimulator:[x]  No []  Yes    Has A PICC line: left upper arm for antibx     History of Sleep Apnea? [x]  No []  Yes   CPAP used?- [x]  No []  Yes    Does the patient monitor blood sugar?          []  No []  Yes  [x]  N/A  Patient has: [x]  NO Hx DM   []  Pre-DM                 []  DM1  []   DM2  Blood Thinner / Instructions:  none Aspirin Instructions:  ASA 81 mg   hold x 7 days  ERAS Protocol Ordered: [x]  No  []  Yes Patient is to be NPO after: midnight prior  Patient was given the 5 CHG shower / bath instructions for Shoulder arthroplasty Revision surgery along with 2 bottles of the CHG soap. Patient will start this UJ:[id number]  October 28, 2022   All questions were asked and answered, Patient voiced understanding of this process.   The patient was given Benzoyl peroxide Gel as ordered. Instruction regarding application starting 2 days prior to surgery was given and patient voiced understanding.   Patient states that she received a message that her surgery was now posted for 1630 on 11-01-2022. At PST, the procedure time was 0730 with her arriving at 0515 on 11-01-2022. I left a message with Cordelia Pen to verify her time of surgery. Ms. Gutkowski is very upset at the thought of having to wait until 1630 d/t being NPO that long and also  she has transportation issues at that time.   Activity level: Patient is able to climb a flight of stairs without difficulty; [x]  No CP  [x]  No SOB.  Patient can perform ADLs without assistance.   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  Patient verbalized understanding and agreement to the Pre-Surgical Instructions that were given to them at this PAT appointment. Patient was also educated of the need to review these PAT  instructions again prior to her surgery.I reviewed the appropriate phone numbers to call if they have any and questions or concerns.

## 2022-10-25 ENCOUNTER — Other Ambulatory Visit: Payer: Self-pay

## 2022-10-25 ENCOUNTER — Encounter (HOSPITAL_COMMUNITY)
Admission: RE | Admit: 2022-10-25 | Discharge: 2022-10-25 | Disposition: A | Discharge status: Home / Self Care | Payer: Medicare Other | Source: Ambulatory Visit | Attending: Orthopaedic Surgery | Admitting: Orthopaedic Surgery

## 2022-10-25 ENCOUNTER — Encounter (HOSPITAL_COMMUNITY): Payer: Self-pay

## 2022-10-25 VITALS — BP 137/78 | HR 87 | Temp 98.6°F | Resp 18 | Ht 68.0 in | Wt 154.0 lb

## 2022-10-25 DIAGNOSIS — Z01812 Encounter for preprocedural laboratory examination: Secondary | ICD-10-CM | POA: Insufficient documentation

## 2022-10-25 DIAGNOSIS — X58XXXS Exposure to other specified factors, sequela: Secondary | ICD-10-CM | POA: Insufficient documentation

## 2022-10-25 DIAGNOSIS — Z79891 Long term (current) use of opiate analgesic: Secondary | ICD-10-CM | POA: Diagnosis not present

## 2022-10-25 DIAGNOSIS — T8459XS Infection and inflammatory reaction due to other internal joint prosthesis, sequela: Secondary | ICD-10-CM | POA: Diagnosis not present

## 2022-10-25 DIAGNOSIS — Z01818 Encounter for other preprocedural examination: Secondary | ICD-10-CM

## 2022-10-25 DIAGNOSIS — Z96619 Presence of unspecified artificial shoulder joint: Secondary | ICD-10-CM | POA: Diagnosis not present

## 2022-10-25 HISTORY — DX: Gastro-esophageal reflux disease without esophagitis: K21.9

## 2022-10-25 LAB — CBC
HCT: 32.8 % — ABNORMAL LOW (ref 36.0–46.0)
Hemoglobin: 10.4 g/dL — ABNORMAL LOW (ref 12.0–15.0)
MCH: 31.4 pg (ref 26.0–34.0)
MCHC: 31.7 g/dL (ref 30.0–36.0)
MCV: 99.1 fL (ref 80.0–100.0)
Platelets: 436 10*3/uL — ABNORMAL HIGH (ref 150–400)
RBC: 3.31 MIL/uL — ABNORMAL LOW (ref 3.87–5.11)
RDW: 15 % (ref 11.5–15.5)
WBC: 6.3 10*3/uL (ref 4.0–10.5)
nRBC: 0 % (ref 0.0–0.2)

## 2022-10-25 LAB — COMPREHENSIVE METABOLIC PANEL
ALT: 22 U/L (ref 0–44)
AST: 23 U/L (ref 15–41)
Albumin: 3.4 g/dL — ABNORMAL LOW (ref 3.5–5.0)
Alkaline Phosphatase: 79 U/L (ref 38–126)
Anion gap: 8 (ref 5–15)
BUN: 17 mg/dL (ref 8–23)
CO2: 25 mmol/L (ref 22–32)
Calcium: 9.3 mg/dL (ref 8.9–10.3)
Chloride: 106 mmol/L (ref 98–111)
Creatinine, Ser: 0.64 mg/dL (ref 0.44–1.00)
GFR, Estimated: >60 mL/min (ref >60)
Glucose, Bld: 85 mg/dL (ref 70–99)
Potassium: 4 mmol/L (ref 3.5–5.1)
Sodium: 139 mmol/L (ref 135–145)
Total Bilirubin: 0.3 mg/dL (ref 0.3–1.2)
Total Protein: 6.8 g/dL (ref 6.5–8.1)

## 2022-10-25 LAB — SURGICAL PCR SCREEN
MRSA, PCR: NEGATIVE
Staphylococcus aureus: NEGATIVE

## 2022-10-30 ENCOUNTER — Telehealth: Payer: Self-pay

## 2022-10-30 NOTE — Telephone Encounter (Signed)
Raymondo Band, MD  Pricilla Loveless, Aryanne Gilleland T, CMA; Zena Amos, Rozell Searing, MD She should see dr Elinor Parkinson in person for iv to PO abx switch. Or at least wait until dr Elinor Parkinson is available who knows the case more to discuss  thanks       Previous Messages    ----- Message ----- From: Judith Part, CMA Sent: 10/27/2022   8:35 AM EDT To: Sedalia Muta; Odette Fraction, MD; * Subject: RE: Update on pending surgery ? end date for*  Patient would like orders given to have picc removed before surgery. She does not want to continue IV abx and states she does not feel a need for the IV abx.   Thanks ----- Message ----- From: Sedalia Muta Sent: 10/26/2022  11:23 PM EDT To: Sedalia Muta; Odette Fraction, MD; * Subject: RE: Update on pending surgery ? end date for*  Dr. Renold Don and Malon Siddall, Ms Anner Crete shared with our RN today that she will stay overnight for observation after her surgery on 7/24 therefore she will not be able to keep her appt with Dr. Elinor Parkinson on 7/24 @ 4 PM. Pt original OPAT is for Dapto and Ceftriaxone thru 11/14/22 however, pt states she is stopping on day of surgery.  Do you want to reach out to patient to reschedule her appt prior to 7/24?  We don't want to send the IVABX and waste expensive drug if she refuses to infuse.  Please advise.  Thank you.   Pam  ----- Message ----- From: Raymondo Band, MD Sent: 10/24/2022   9:57 AM EDT To: Sedalia Muta; Odette Fraction, MD; * Subject: RE: Update on pending surgery ? end date for*  Please let patient know to keep appointment with dr Elinor Parkinson on that day and abx decision can be made by her at that time ----- Message ----- From: Judith Part, CMA Sent: 10/23/2022  11:12 AM EDT To: Odette Fraction, MD Subject: FW: Update on pending surgery ? end date for*   ----- Message ----- From: Sedalia Muta Sent: 10/23/2022  11:04 AM EDT To: Robinette Haines, RPH-CPP; * Subject:  Update on pending surgery ? end date for IVA*  Hi team. Connecting on this patient. Ms. Butrick is planned for 6 weeks of IVABX based on Dr. Leafy Half OPAT, however, the pt shared with the RN today that she Dr. Everardo Pacific has surgery planned on 7/24 and will be stopping the IVABX??? I see pt has appt with Dr. Elinor Parkinson also on 7/24 so not sure if this was intentional based on surgery???  The RN just wanted to ensure you were aware.  Please let us know if any changes to OPAT plan. Thanks all.  Pam

## 2022-10-30 NOTE — H&P (Signed)
PREOPERATIVE H&P  Chief Complaint: right shoulder infection  HPI: Dawn Chen is a 62 y.o. female who presents for preoperative history and physical prior to scheduled surgery, Procedure(s): TOTAL SHOULDER REVISION.   Patient has a past medical history significant for HTN, HLD, chronic pain in pain management.   Patient had a revision right total shoulder arthroplasty with antibiotic spacer implantation on 10/04/2022. She continued to have pain. She had a pop when she was opening a container with her antibiotics.   Symptoms are rated as moderate to severe, and have been worsening.  This is significantly impairing activities of daily living.    Please see clinic note for further details on this patient's care.    She has elected for surgical management.   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   Past Surgical History:  Procedure Laterality Date   ABDOMINAL HYSTERECTOMY     CARPAL TUNNEL RELEASE Bilateral 2001   CHOLECYSTECTOMY     GASTRIC BYPASS  2013   GYNECOLOGIC CRYOSURGERY     HYSTEROTOMY  2007   JOINT REPLACEMENT Right 03/2013   total knee   LUMBAR FUSION  2014   SHOULDER ACROMIOPLASTY Right 2011   SHOULDER ARTHROSCOPY WITH ROTATOR CUFF REPAIR Right 08/18/2014   Procedure: RIGHT SHOULDER  SUBSCAPULARIS REPAIR;  Surgeon: Jones Broom, MD;  Location: [facility];  Service: Orthopedics;  Laterality: Right;  Right shoulder subscapularis repair   SYNOVECTOMY Right 10/04/2022   Procedure: SYNOVECTOMY;  Surgeon: Bjorn Pippin, MD;  Location: [facility];  Service: Orthopedics;  Laterality: Right;   TOTAL SHOULDER ARTHROPLASTY Right 07/30/2014   Procedure: TOTAL SHOULDER ARTHROPLASTY;  Surgeon:  Jones Broom, MD;  Location: [facility] OR;  Service: Orthopedics;  Laterality: Right;  Right shoulder arthroplasty   TOTAL SHOULDER REVISION Right 10/04/2022   Procedure: RIGHT HEMIARTHROPLASTY WITH ANTIBIOTIC SPACER;  Surgeon: Bjorn Pippin, MD;  Location: [facility];  Service: Orthopedics;  Laterality: Right;   Social History   Socioeconomic History   Marital status: Divorced    Spouse name: Not on file   Number of children: Not on file   Years of education: Not on file   Highest education level: Not on file  Occupational History   Not on file  Tobacco Use   Smoking status: Former    Current packs/day: 0.00    Types: Cigarettes    Quit date: 09/08/2017    Years since quitting: 5.1   Smokeless tobacco: Never   Tobacco comments:    Smoking varies  Vaping Use   Vaping status: Never Used  Substance and Sexual Activity   Alcohol use: No   Drug use: No   Sexual activity: Yes  Other Topics Concern   Not on file  Social History Narrative   Not on file   Social Determinants of Health   Financial Resource Strain: Low Risk  (06/17/2021)   Received from [facility]   Overall Financial Resource Strain (CARDIA)    Difficulty of Paying Living Expenses: Not hard at all  Food Insecurity: No Food Insecurity (10/04/2022)   Hunger Vital Sign  Worried About Programme researcher, broadcasting/film/video in the Last Year: Never true    Ran Out of Food in the Last Year: Never true  Transportation Needs: No Transportation Needs (10/04/2022)   PRAPARE - Administrator, Civil Service (Medical): No    Lack of Transportation (Non-Medical): No  Physical Activity: Not on file  Stress: Not on file  Social Connections: Unknown (08/21/2021)   Received from [facility]   Social Network    Social Network: Not on file   Family History  Problem Relation Age of Onset   Breast cancer Maternal Aunt    Cancer Maternal Aunt    Breast cancer Maternal Grandmother    Cancer  Maternal Grandmother    Allergies  Allergen Reactions   Diflucan [Fluconazole] Other (See Comments)    Headache    Gabapentin     Pass out    Hydrochlorothiazide Other (See Comments)    Dizzy   Methadone Nausea And Vomiting   Pregabalin Other (See Comments)    Unable to function   Prior to Admission medications   Medication Sig Start Date End Date Taking? Authorizing Provider  AMITIZA 24 MCG capsule Take 24 mcg by mouth 2 (two) times daily. 11/04/17  Yes [provider]  aspirin (ASPIRIN CHILDRENS) 81 MG chewable tablet Chew 1 tablet (81 mg total) by mouth 2 (two) times daily. For 6 weeks for DVT prophylaxis after surgery Patient taking differently: Chew 81 mg by mouth daily. 10/06/22 11/17/22 Yes Betzy Barbier, Jerald Kief, PA-C  COLLAGEN PO Take 30 mLs by mouth daily.   Yes [provider]  COLLAGEN PO Take 3 capsules by mouth daily.   Yes [provider]  conjugated estrogens (PREMARIN) vaginal cream Place 1 applicator vaginally daily as needed (dryness/irritation).   Yes [provider]  cyclobenzaprine (FLEXERIL) 10 MG tablet Take 10 mg by mouth 2 (two) times daily.   Yes [provider]  diphenhydramine-acetaminophen (TYLENOL PM) 25-500 MG TABS tablet Take 2 tablets by mouth at bedtime.   Yes [provider]  naproxen (NAPROSYN) 500 MG tablet Take 1 tablet (500 mg total) by mouth 2 (two) times daily with a meal. Patient taking differently: Take 500 mg by mouth 2 (two) times daily as needed for moderate pain. 10/06/22 11/05/22 Yes Deaunte Dente, Jerald Kief, PA-C  omeprazole (PRILOSEC) 20 MG capsule Take 1 capsule (20 mg total) by mouth daily. To gastric protection while taking NSAIDs Patient taking differently: Take 20 mg by mouth daily as needed (acid reflux). 10/06/22 11/05/22 Yes Eldo Umanzor, Jerald Kief, PA-C  ondansetron (ZOFRAN) 4 MG tablet Take 4 mg by mouth every 8 (eight) hours as needed for nausea or vomiting.   Yes [provider]   oxycodone (ROXICODONE) 30 MG immediate release tablet Take 30 mg by mouth every 4 (four) hours as needed for pain. 10/17/17  Yes [provider]  terbinafine (LAMISIL) 250 MG tablet Take 1 tablet (250 mg total) by mouth daily. 04/30/19  Yes Candelaria Stagers, DPM  traZODone (DESYREL) 100 MG tablet Take 100 mg by mouth at bedtime as needed for sleep.   Yes [provider]  triamcinolone cream (KENALOG) 0.1 % Apply 1 Application topically daily as needed (rash).   Yes [provider]  vancomycin (VANCOCIN) 10 G SOLR injection Inject into the vein every 12 (twelve) hours. 1 dose injected every 12 hours   Yes [provider]  Vitamin D, Ergocalciferol, (DRISDOL) 1.25 MG (50000 UNIT) CAPS capsule Take 50,000 Units  by mouth every 7 (seven) days.   Yes [provider]  cefTRIAXone (ROCEPHIN) IVPB Inject 2 g into the vein daily. Indication:  right shoulder PJI First Dose: No Last Day of Therapy:  11/14/2022 Labs - Once weekly:  CBC/D and BMP, Labs - Once weekly: ESR and CRP Method of administration: IV Push Method of administration may be changed at the discretion of home infusion pharmacist based upon assessment of the patient and/or caregiver's ability to self-administer the medication ordered. Patient not taking: Reported on 10/20/2022 10/06/22 11/14/22  Odette Fraction, MD  daptomycin (CUBICIN) IVPB Inject 500 mg into the vein daily. Indication: right shoulder PJI First Dose: No Last Day of Therapy:  11/14/2022 Labs - Once weekly:  CBC/D, BMP, and CPK Labs - Once weekly: ESR and CRP Method of administration: IV Push Method of administration may be changed at the discretion of home infusion pharmacist based upon assessment of the patient and/or caregiver's ability to self-administer the medication ordered. Patient not taking: Reported on 10/20/2022 10/06/22 11/14/22  Odette Fraction, MD  methocarbamol (ROBAXIN-750) 750 MG tablet Take 1 tablet (750 mg total) by mouth  every 8 (eight) hours as needed for muscle spasms. Patient not taking: Reported on 10/20/2022 10/06/22   Vernetta Honey, PA-C    ROS: All other systems have been reviewed and were otherwise negative with the exception of those mentioned in the HPI and as above.  Physical Exam: General: Alert, no acute distress Cardiovascular: No pedal edema Respiratory: No cyanosis, no use of accessory musculature GI: No organomegaly, abdomen is soft and non-tender Skin: No lesions in the area of chief complaint Neurologic: Sensation intact distally Psychiatric: Patient is competent for consent with normal mood and affect Lymphatic: No axillary or cervical lymphadenopathy  MUSCULOSKELETAL:  Not able to test for range of motion secondary to a known implant and recent surgery. Incisions is relatively benign. There is some swelling and hematoma formation.  Imaging: X-ray single view of the right shoulder demonstrating a fractured and loose antibiotic spacer  BMI: Estimated body mass index is 23.42 kg/m as calculated from the following:   Height as of 10/25/22: 5\' 8"  (1.727 m).   Weight as of 10/25/22: 69.9 kg.  Lab Results  Component Value Date   ALBUMIN 3.4 (L) 10/25/2022   Diabetes: Patient does not have a diagnosis of diabetes.     Smoking Status:      Assessment: right shoulder infection  Plan: Plan for Procedure(s): TOTAL SHOULDER REVISION  Based on the patient symptoms and gross infection, her proximal humeral bone is quite weak. Her spacer has clearly broken loose. I think it is appropriate to consider an urgent re-implantation, but not in an emergent setting.  I would like for her to get a few more weeks of antibiotics in. We will plan to do this in about three weeks to try and allow more time to pass before implanting with final implants. Unfortunately, revising her spacer is likely not going to be particularly beneficial as it will likely still have a high risk of failure as the  proximal bone is quite weak and we will have to use a diaphyseal fit stem and likely a femoral strut allograft.   She understands the plan of care.  The risks, benefits and concerns of a re-implantation were discussed. We will work on surgical scheduling for the end of the month. The risks of dislocation, infection and pain are increased.   The risks benefits and alternatives were discussed with the  patient including but not limited to the risks of nonoperative treatment, versus surgical intervention including infection, bleeding, nerve injury,  blood clots, cardiopulmonary complications, morbidity, mortality, among others, and they were willing to proceed.   We additionally specifically discussed risks of axillary nerve injury, infection, periprosthetic fracture, continued pain and longevity of implants prior to beginning procedure.    Patient will be admitted for inpatient treatment for surgery, pain control, OT, prophylactic antibiotics, VTE prophylaxis, and discharge planning. The patient is planning to be discharged home with outpatient PT.   The patient acknowledged the explanation, agreed to proceed with the plan and consent was signed.   Operative Plan: Right revision to reverse total shoulder arthroplasty with allograft Discharge Medications: standard DVT Prophylaxis: aspirin Physical Therapy: outpatient PT - delayed Special Discharge needs: Sling.    Vernetta Honey, PA-C  10/30/2022 3:35 PM

## 2022-10-30 NOTE — Telephone Encounter (Signed)
Patient informed to keep follow up and Dr. Elinor Parkinson will discuss IV abx

## 2022-10-31 ENCOUNTER — Telehealth (INDEPENDENT_AMBULATORY_CARE_PROVIDER_SITE_OTHER): Payer: Medicare Other | Admitting: Infectious Diseases

## 2022-10-31 ENCOUNTER — Telehealth: Payer: Self-pay

## 2022-10-31 DIAGNOSIS — T8459XA Infection and inflammatory reaction due to other internal joint prosthesis, initial encounter: Secondary | ICD-10-CM

## 2022-10-31 DIAGNOSIS — Z452 Encounter for adjustment and management of vascular access device: Secondary | ICD-10-CM | POA: Diagnosis not present

## 2022-10-31 DIAGNOSIS — Z96619 Presence of unspecified artificial shoulder joint: Secondary | ICD-10-CM | POA: Insufficient documentation

## 2022-10-31 DIAGNOSIS — Z96611 Presence of right artificial shoulder joint: Secondary | ICD-10-CM | POA: Diagnosis not present

## 2022-10-31 DIAGNOSIS — Z79899 Other long term (current) drug therapy: Secondary | ICD-10-CM

## 2022-10-31 NOTE — Addendum Note (Signed)
Addended by: Odette Fraction on: 10/31/2022 09:00 AM   Modules accepted: Orders

## 2022-10-31 NOTE — Anesthesia Preprocedure Evaluation (Signed)
Anesthesia Evaluation  Patient identified by MRN, date of birth, ID band Patient awake    Reviewed: Allergy & Precautions, H&P , NPO status , Patient's Chart, lab work & pertinent test results  Airway Mallampati: II  TM Distance: >3 FB Neck ROM: Full    Dental no notable dental hx. (+) Missing, Dental Advisory Given, Poor Dentition   Pulmonary neg pulmonary ROS, sleep apnea , former smoker   Pulmonary exam normal breath sounds clear to auscultation       Cardiovascular negative cardio ROS Normal cardiovascular exam Rhythm:Regular Rate:Normal     Neuro/Psych  PSYCHIATRIC DISORDERS Anxiety Depression    Chronic pain  Neuromuscular disease    GI/Hepatic negative GI ROS, Neg liver ROS,GERD  ,,  Endo/Other  negative endocrine ROS    Renal/GU negative Renal ROS  negative genitourinary   Musculoskeletal negative musculoskeletal ROS (+) Arthritis , Osteoarthritis,    Abdominal   Peds negative pediatric ROS (+)  Hematology negative hematology ROS (+) Blood dyscrasia, anemia   Anesthesia Other Findings   Reproductive/Obstetrics negative OB ROS                              Anesthesia Physical Anesthesia Plan  ASA: 3  Anesthesia Plan: General   Post-op Pain Management: Regional block*   Induction: Intravenous  PONV Risk Score and Plan: 3 and Ondansetron, Dexamethasone, Midazolam and Treatment may vary due to age or medical condition  Airway Management Planned: Oral ETT  Additional Equipment: None  Intra-op Plan:   Post-operative Plan: Extubation in OR  Informed Consent: I have reviewed the patients History and Physical, chart, labs and discussed the procedure including the risks, benefits and alternatives for the proposed anesthesia with the patient or authorized representative who has indicated his/her understanding and acceptance.     Dental advisory given  Plan Discussed with:  CRNA and Surgeon  Anesthesia Plan Comments:         Anesthesia Quick Evaluation

## 2022-10-31 NOTE — Progress Notes (Addendum)
Virtual Visit via Video Note  I connected withNAME@ on 10/31/22 at  8:45 AM EDT by a video enabled telemedicine application and verified that I am speaking with the correct person using two identifiers.  Location: Patient: Home  Provider: [facility]   I discussed the limitations of evaluation and management by telemedicine and the availability of in person appointments. The patient expressed understanding and agreed to proceed.  [facility]  Patient Active Problem List   Diagnosis Date Noted   Status post reverse total arthroplasty of right shoulder 10/04/2022   Glenohumeral arthritis 07/30/2014   Chronic lumbar pain 07/09/2012   Lumbar degenerative disc disease 07/09/2012    Current Outpatient Medications on File Prior to Visit  Medication Sig Dispense Refill   AMITIZA 24 MCG capsule Take 24 mcg by mouth 2 (two) times daily.     aspirin (ASPIRIN CHILDRENS) 81 MG chewable tablet Chew 1 tablet (81 mg total) by mouth 2 (two) times daily. For 6 weeks for DVT prophylaxis after surgery (Patient taking differently: Chew 81 mg by mouth daily.) 84 tablet 0   COLLAGEN PO Take 30 mLs by mouth daily.     COLLAGEN PO Take 3 capsules by mouth daily.     conjugated estrogens (PREMARIN) vaginal cream Place 1 applicator vaginally daily as needed (dryness/irritation).     cyclobenzaprine (FLEXERIL) 10 MG tablet Take 10 mg by mouth 2 (two) times daily.     diphenhydramine-acetaminophen (TYLENOL PM) 25-500 MG TABS tablet Take 2 tablets by mouth at bedtime.     methocarbamol (ROBAXIN-750) 750 MG tablet Take 1 tablet (750 mg total) by mouth every 8 (eight) hours as needed for muscle spasms. 30 tablet 0   naproxen (NAPROSYN) 500 MG tablet Take 1 tablet (500 mg total) by mouth 2 (two) times daily with a meal. (Patient taking differently: Take 500 mg by mouth 2 (two) times daily as needed for moderate pain.) 60 tablet 0   omeprazole (PRILOSEC) 20 MG capsule Take 1 capsule (20 mg total) by  mouth daily. To gastric protection while taking NSAIDs (Patient taking differently: Take 20 mg by mouth daily as needed (acid reflux).) 30 capsule 0   ondansetron (ZOFRAN) 4 MG tablet Take 4 mg by mouth every 8 (eight) hours as needed for nausea or vomiting.     oxycodone (ROXICODONE) 30 MG immediate release tablet Take 30 mg by mouth every 4 (four) hours as needed for pain.  0   terbinafine (LAMISIL) 250 MG tablet Take 1 tablet (250 mg total) by mouth daily. 90 tablet 0   traZODone (DESYREL) 100 MG tablet Take 100 mg by mouth at bedtime as needed for sleep.     triamcinolone cream (KENALOG) 0.1 % Apply 1 Application topically daily as needed (rash).     vancomycin (VANCOCIN) 10 G SOLR injection Inject into the vein every 12 (twelve) hours. 1 dose injected every 12 hours     Vitamin D, Ergocalciferol, (DRISDOL) 1.25 MG (50000 UNIT) CAPS capsule Take 50,000 Units by mouth every 7 (seven) days.     No current facility-administered medications on file prior to visit.     History of Present Illness: 62 year old female with prior history of Lumbar fusion, RT TKA, Gastric bypass, complicated surgical history in her right shoulder with initial right shoulder arthroscopy approximately 10 years ago followed by right anatomic total shoulder replacement on July 30, 2014 with initial noncompliance, and concern for subcapsularis failure in her first postop visit, status post right shoulder subscapularis  repair on Aug 18, 2014, lost to follow-up after August 2016, revision anatomic total shoulder arthroplasty with Dr. Griffith Citron with [facility] orthopedics in May 2017 who is here for hospital follow-up for right shoulder PJI. 6/25 status post removal of right total shoulder arthroplasty and synovectomy, right hemiarthroplasty with antibiotic impregnated implant. Purulence noted in OR as well as cavitary lesion in the glenoid that was bone grafted. Multiple OR cx sent 1 of which was positive for Corynebacterium JK.   Patient was initially discharged on IV daptomycin and ceftriaxone pending cultures on 6/28 however, antibiotics were switched to vancomycin after or cultures resulted as Corynebacterium JK.  Patient has been scheduled total shoulder revision on 7/24.  Patient also informed [facility] nurse that she wanted PICC line out before her scheduled surgery.  I personally spoke with Dr. Everardo Pacific who told me that patient had to be taken back to surgery because of fracture of the antibiotic spacer.  He actually recommends completion of recommended course of prolonged IV antibiotics for prosthetic joint infection and thinks is an infected joint.   Today  She wants PICC out any cost.  She feels like she cannot do anything with the PICC line.  She reports she cannot go to her college.  She cannot move around her house.  Denies any concerns with the PICC like redness, swelling or tenderness.  Denies any fevers, chills, nausea vomiting or diarrhea.  Denies any new concerns in her right shoulder.  Discussed at length regarding completion of course of IV antibiotics for complicated infection involving her right prosthetic shoulder and chances of persistence as well as recurrence of infection with incomplete treatment.  She finally agreed to completion of IV course until November 29, 2022 which will be 4 weeks s/p revision on 7/24 instead of 6 weeks which I would have preferred.   Review of Systems: all systems reviewed with pertinent positives and negatives as listed above   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   Past Surgical History:  Procedure Laterality Date   ABDOMINAL HYSTERECTOMY     CARPAL TUNNEL RELEASE Bilateral 2001    CHOLECYSTECTOMY     GASTRIC BYPASS  2013   GYNECOLOGIC CRYOSURGERY     HYSTEROTOMY  2007   JOINT REPLACEMENT Right 03/2013   total knee   LUMBAR FUSION  2014   SHOULDER ACROMIOPLASTY Right 2011   SHOULDER ARTHROSCOPY WITH ROTATOR CUFF REPAIR Right 08/18/2014   Procedure: RIGHT SHOULDER  SUBSCAPULARIS REPAIR;  Surgeon: Jones Broom, MD;  Location: [facility] OR;  Service: Orthopedics;  Laterality: Right;  Right shoulder subscapularis repair   SYNOVECTOMY Right 10/04/2022   Procedure: SYNOVECTOMY;  Surgeon: Bjorn Pippin, MD;  Location: [facility] ORS;  Service: Orthopedics;  Laterality: Right;   TOTAL SHOULDER ARTHROPLASTY Right 07/30/2014   Procedure: TOTAL SHOULDER ARTHROPLASTY;  Surgeon: Jones Broom, MD;  Location: [facility] OR;  Service: Orthopedics;  Laterality: Right;  Right shoulder arthroplasty   TOTAL SHOULDER REVISION Right 10/04/2022   Procedure: RIGHT HEMIARTHROPLASTY WITH ANTIBIOTIC SPACER;  Surgeon: Bjorn Pippin, MD;  Location: [facility]S;  Service: Orthopedics;  Laterality: Right;     Social  History   Tobacco Use   Smoking status: Former    Current packs/day: 0.00    Types: Cigarettes    Quit date: 09/08/2017    Years since quitting: 5.1   Smokeless tobacco: Never   Tobacco comments:    Smoking varies  Vaping Use   Vaping status: Never Used  Substance Use Topics   Alcohol use: No   Drug use: No    Family History  Problem Relation Age of Onset   Breast cancer Maternal Aunt    Cancer Maternal Aunt    Breast cancer Maternal Grandmother    Cancer Maternal Grandmother     Allergies  Allergen Reactions   Diflucan [Fluconazole] Other (See Comments)    Headache    Gabapentin     Pass out    Hydrochlorothiazide Other (See Comments)    Dizzy   Methadone Nausea And Vomiting   Pregabalin Other (See Comments)    Unable to function    Health Maintenance  Topic Date Due   COVID-19 Vaccine (1) Never done   HIV Screening  Never done   Hepatitis C Screening  Never done    DTaP/Tdap/Td (1 - Tdap) Never done   Zoster Vaccines- Shingrix (1 of 2) Never done   PAP SMEAR-Modifier  Never done   Colonoscopy  Never done   INFLUENZA VACCINE  11/09/2022   Medicare Annual Wellness (AWV)  05/13/2023   MAMMOGRAM  07/06/2024   HPV VACCINES  Aged Out    Observations/Objective:   Assessment and Plan: 62 year old female with prior history of Lumbar fusion, RT TKA, Gastric bypass, complicated surgical history in her right shoulder with initial right shoulder arthroscopy approximately 10 years ago followed by right anatomic total shoulder replacement on July 30, 2014 with initial noncompliance, and concern for subcapsularis failure in her first postop visit, status post right shoulder subscapularis repair on Aug 18, 2014, lost to follow-up after August 2016, revision anatomic total shoulder arthroplasty with Dr. Griffith Citron with [facility] orthopedics in May 2017 with    # RT shoulder PJI In the setting of prior complicated surgical h/o as below  6/25 status post removal of right total shoulder arthroplasty and synovectomy, right hemiarthroplasty with antibiotic impregnated implant. Purulence noted in OR as well as cavitary lesion in the glenoid that was bone grafted. Multiple OR cx sent, 1 of which + for Corynebacterium JK   Plan  Complete 4 weeks of IV Vancomycin from 7/24 post operatively then switch to PO abtx like linezolid/omadacycline for 3-6 months duration pending clinical progress. She is not willing to continue IV abtx at all after 11/29/22.  Fu in 4 weeks prior to EOT  OPAT  Diagnosis: Rt shoulder PJI   Culture Result: Corynebacterium JK  Allergies  Allergen Reactions   Diflucan [Fluconazole] Other (See Comments)    Headache    Gabapentin     Pass out    Hydrochlorothiazide Other (See Comments)    Dizzy   Methadone Nausea And Vomiting   Pregabalin Other (See Comments)    Unable to function    OPAT Orders Discharge antibiotics to be given via PICC  line Discharge antibiotics:Vancomycin, pharmacy to dose  Per pharmacy protocol  Aim for Vancomycin trough 15-20 or AUC 400-550 (unless otherwise indicated) End Date: 11/29/22  [facility] Care Per Protocol:  Home health RN for IV administration and teaching; PICC line care and labs.    Labs weekly while on IV antibiotics: X__ CBC with differential X__ BMP __ CMP __  CRP __ ESR X__ Vancomycin trough twice a week __ CK  X__ Please pull PIC at completion of IV antibiotics __ Please leave PIC in place until doctor has seen patient or been notified  Fax weekly labs to [phone]  Clinic Follow Up Appt: 4 weeks   # PICC No concerns   # Medication management  Request last set of labs from [facility] esp Vancomycin trough  7/17 CBC and CMP reviewed from [facility]  Follow Up Instructions: 4 weeks    I discussed the assessment and treatment plan with the patient. The patient was provided an opportunity to ask questions and all were answered. The patient agreed with the plan and demonstrated an understanding of the instructions.   The patient was advised to call back or seek an in-person evaluation if the symptoms worsen or if the condition fails to improve as anticipated.  I provided 41 minutes of non-face-to-face time during this encounter.  Victoriano Lain, MD [facility] [phone] pager   [phone] cell 10/31/2022, 8:26 AM

## 2022-10-31 NOTE — Telephone Encounter (Signed)
  Per provider ok to PULL PICC after End Date.   Provider: Dr Elinor Parkinson  End Date: 11/29/22  Patient Requesting [facility] nurse Melissa.  Advised that [facility] has no control over who comes to the visit in home.    Notified [facility] and [facility].

## 2022-11-01 ENCOUNTER — Inpatient Hospital Stay (HOSPITAL_COMMUNITY): Payer: Self-pay | Admitting: Registered Nurse

## 2022-11-01 ENCOUNTER — Other Ambulatory Visit: Payer: Self-pay | Admitting: Infectious Diseases

## 2022-11-01 ENCOUNTER — Encounter (HOSPITAL_COMMUNITY): Payer: Self-pay | Admitting: Orthopaedic Surgery

## 2022-11-01 ENCOUNTER — Other Ambulatory Visit: Payer: Self-pay

## 2022-11-01 ENCOUNTER — Encounter: Payer: Self-pay | Admitting: Infectious Diseases

## 2022-11-01 ENCOUNTER — Encounter (HOSPITAL_COMMUNITY)
Admission: RE | Disposition: A | Discharge status: Home / Self Care | Payer: Self-pay | Source: Home / Self Care | Attending: Orthopaedic Surgery

## 2022-11-01 ENCOUNTER — Inpatient Hospital Stay (HOSPITAL_COMMUNITY): Payer: Medicare Other

## 2022-11-01 ENCOUNTER — Inpatient Hospital Stay (HOSPITAL_COMMUNITY)
Admission: RE | Admit: 2022-11-01 | Discharge: 2022-11-02 | DRG: 483 | Disposition: A | Discharge status: Home / Self Care | Payer: Medicare Other | Attending: Orthopaedic Surgery | Admitting: Orthopaedic Surgery

## 2022-11-01 ENCOUNTER — Inpatient Hospital Stay (HOSPITAL_COMMUNITY): Payer: Medicare Other | Admitting: Registered Nurse

## 2022-11-01 ENCOUNTER — Inpatient Hospital Stay: Payer: Medicare Other | Admitting: Infectious Diseases

## 2022-11-01 DIAGNOSIS — Z96611 Presence of right artificial shoulder joint: Principal | ICD-10-CM

## 2022-11-01 DIAGNOSIS — E785 Hyperlipidemia, unspecified: Secondary | ICD-10-CM | POA: Diagnosis present

## 2022-11-01 DIAGNOSIS — Z7982 Long term (current) use of aspirin: Secondary | ICD-10-CM | POA: Diagnosis not present

## 2022-11-01 DIAGNOSIS — F419 Anxiety disorder, unspecified: Secondary | ICD-10-CM | POA: Diagnosis present

## 2022-11-01 DIAGNOSIS — Z981 Arthrodesis status: Secondary | ICD-10-CM | POA: Diagnosis not present

## 2022-11-01 DIAGNOSIS — Z90710 Acquired absence of both cervix and uterus: Secondary | ICD-10-CM | POA: Diagnosis not present

## 2022-11-01 DIAGNOSIS — M1990 Unspecified osteoarthritis, unspecified site: Secondary | ICD-10-CM

## 2022-11-01 DIAGNOSIS — F32A Depression, unspecified: Secondary | ICD-10-CM | POA: Diagnosis present

## 2022-11-01 DIAGNOSIS — K219 Gastro-esophageal reflux disease without esophagitis: Secondary | ICD-10-CM | POA: Diagnosis present

## 2022-11-01 DIAGNOSIS — Z803 Family history of malignant neoplasm of breast: Secondary | ICD-10-CM

## 2022-11-01 DIAGNOSIS — T8459XA Infection and inflammatory reaction due to other internal joint prosthesis, initial encounter: Secondary | ICD-10-CM | POA: Diagnosis present

## 2022-11-01 DIAGNOSIS — Y792 Prosthetic and other implants, materials and accessory orthopedic devices associated with adverse incidents: Secondary | ICD-10-CM | POA: Diagnosis present

## 2022-11-01 DIAGNOSIS — I10 Essential (primary) hypertension: Secondary | ICD-10-CM | POA: Diagnosis present

## 2022-11-01 DIAGNOSIS — Z87891 Personal history of nicotine dependence: Secondary | ICD-10-CM

## 2022-11-01 DIAGNOSIS — Z79899 Other long term (current) drug therapy: Secondary | ICD-10-CM

## 2022-11-01 DIAGNOSIS — M25511 Pain in right shoulder: Secondary | ICD-10-CM | POA: Diagnosis present

## 2022-11-01 DIAGNOSIS — T84038A Mechanical loosening of other internal prosthetic joint, initial encounter: Secondary | ICD-10-CM | POA: Diagnosis present

## 2022-11-01 DIAGNOSIS — Z9884 Bariatric surgery status: Secondary | ICD-10-CM

## 2022-11-01 HISTORY — PX: TOTAL SHOULDER REVISION: SHX6130

## 2022-11-01 LAB — TYPE AND SCREEN
ABO/RH(D): A POS
Antibody Screen: NEGATIVE

## 2022-11-01 SURGERY — REVISION, TOTAL ARTHROPLASTY, SHOULDER
Anesthesia: General | Site: Shoulder | Laterality: Right

## 2022-11-01 MED ORDER — SODIUM CHLORIDE 0.9% FLUSH: 10.0000 mL | Freq: Two times a day (BID) | INTRAVENOUS | Status: DC

## 2022-11-01 MED ORDER — PROPOFOL 10 MG/ML IV BOLUS
INTRAVENOUS | Status: DC | PRN
Start: 2022-11-01 — End: 2022-11-01
  Administered 2022-11-01: 150 mg via INTRAVENOUS

## 2022-11-01 MED ORDER — OXYCODONE HCL 5 MG PO TABS
30.0000 mg | ORAL_TABLET | ORAL | Status: DC | PRN
  Administered 2022-11-01 – 2022-11-02 (×2): 30 mg via ORAL
  Filled 2022-11-01 (×2): qty 6

## 2022-11-01 MED ORDER — PRONTOSAN WOUND IRRIGATION OPTIME
TOPICAL | Status: DC | PRN
  Administered 2022-11-01: 350 mL via TOPICAL

## 2022-11-01 MED ORDER — BISACODYL 10 MG RE SUPP: 10.0000 mg | Freq: Every day | RECTAL | Status: DC | PRN

## 2022-11-01 MED ORDER — ONDANSETRON HCL 4 MG/2ML IJ SOLN: 4.0000 mg | Freq: Once | INTRAMUSCULAR | Status: DC | PRN

## 2022-11-01 MED ORDER — METHOCARBAMOL 500 MG IVPB - SIMPLE MED: 500.0000 mg | Freq: Four times a day (QID) | INTRAVENOUS | Status: DC | PRN

## 2022-11-01 MED ORDER — BUPIVACAINE LIPOSOME 1.3 % IJ SUSP
INTRAMUSCULAR | Status: DC | PRN
  Administered 2022-11-01: 10 mL

## 2022-11-01 MED ORDER — ACETAMINOPHEN 500 MG PO TABS
1000.0000 mg | ORAL_TABLET | Freq: Once | ORAL | Status: DC
  Filled 2022-11-01: qty 2

## 2022-11-01 MED ORDER — CHLORHEXIDINE GLUCONATE CLOTH 2 % EX PADS
6.0000 | MEDICATED_PAD | Freq: Every day | CUTANEOUS | Status: DC
  Administered 2022-11-01: 6 via TOPICAL

## 2022-11-01 MED ORDER — PANTOPRAZOLE SODIUM 40 MG PO TBEC
40.0000 mg | DELAYED_RELEASE_TABLET | Freq: Every day | ORAL | Status: DC
  Administered 2022-11-01: 40 mg via ORAL
  Filled 2022-11-01: qty 1

## 2022-11-01 MED ORDER — NAPROXEN 250 MG PO TABS
250.0000 mg | ORAL_TABLET | Freq: Two times a day (BID) | ORAL | Status: DC
  Administered 2022-11-01 – 2022-11-02 (×2): 250 mg via ORAL
  Filled 2022-11-01 (×2): qty 1

## 2022-11-01 MED ORDER — TRANEXAMIC ACID-NACL 1000-0.7 MG/100ML-% IV SOLN
1000.0000 mg | INTRAVENOUS | Status: AC
  Administered 2022-11-01: 1000 mg via INTRAVENOUS
  Filled 2022-11-01: qty 100

## 2022-11-01 MED ORDER — FENTANYL CITRATE (PF) 100 MCG/2ML IJ SOLN
INTRAMUSCULAR | Status: AC
  Filled 2022-11-01: qty 2

## 2022-11-01 MED ORDER — VANCOMYCIN HCL 1000 MG IV SOLR
INTRAVENOUS | Status: DC | PRN
  Administered 2022-11-01: 1000 mg

## 2022-11-01 MED ORDER — CHLORHEXIDINE GLUCONATE 0.12 % MT SOLN
15.0000 mL | Freq: Once | OROMUCOSAL | Status: AC
  Administered 2022-11-01: 15 mL via OROMUCOSAL

## 2022-11-01 MED ORDER — ONDANSETRON HCL 4 MG/2ML IJ SOLN
INTRAMUSCULAR | Status: DC | PRN
  Administered 2022-11-01: 4 mg via INTRAVENOUS

## 2022-11-01 MED ORDER — SODIUM CHLORIDE 0.9 % IR SOLN
Status: DC | PRN
  Administered 2022-11-01: 3000 mL

## 2022-11-01 MED ORDER — LIDOCAINE 2% (20 MG/ML) 5 ML SYRINGE
INTRAMUSCULAR | Status: DC | PRN
  Administered 2022-11-01: 100 mg via INTRAVENOUS

## 2022-11-01 MED ORDER — DEXAMETHASONE SODIUM PHOSPHATE 10 MG/ML IJ SOLN
INTRAMUSCULAR | Status: AC
  Filled 2022-11-01: qty 1

## 2022-11-01 MED ORDER — METHOCARBAMOL 500 MG PO TABS
500.0000 mg | ORAL_TABLET | Freq: Four times a day (QID) | ORAL | Status: DC | PRN
  Administered 2022-11-01 – 2022-11-02 (×2): 500 mg via ORAL
  Filled 2022-11-01 (×2): qty 1

## 2022-11-01 MED ORDER — DIPHENHYDRAMINE HCL 12.5 MG/5ML PO ELIX: 12.5000 mg | ORAL_SOLUTION | ORAL | Status: DC | PRN

## 2022-11-01 MED ORDER — BUPIVACAINE HCL (PF) 0.5 % IJ SOLN
INTRAMUSCULAR | Status: AC
  Filled 2022-11-01: qty 30

## 2022-11-01 MED ORDER — DOCUSATE SODIUM 100 MG PO CAPS
100.0000 mg | ORAL_CAPSULE | Freq: Two times a day (BID) | ORAL | Status: DC
  Administered 2022-11-01 (×2): 100 mg via ORAL
  Filled 2022-11-01 (×2): qty 1

## 2022-11-01 MED ORDER — ALTEPLASE 2 MG IJ SOLR
2.0000 mg | Freq: Once | INTRAMUSCULAR | Status: AC
  Administered 2022-11-01: 2 mg
  Filled 2022-11-01: qty 2

## 2022-11-01 MED ORDER — ROCURONIUM BROMIDE 10 MG/ML (PF) SYRINGE
PREFILLED_SYRINGE | INTRAVENOUS | Status: DC | PRN
  Administered 2022-11-01: 60 mg via INTRAVENOUS

## 2022-11-01 MED ORDER — VANCOMYCIN HCL 1000 MG IV SOLR
INTRAVENOUS | Status: AC
  Filled 2022-11-01: qty 20

## 2022-11-01 MED ORDER — MIDAZOLAM HCL 2 MG/2ML IJ SOLN
INTRAMUSCULAR | Status: AC
  Filled 2022-11-01: qty 2

## 2022-11-01 MED ORDER — ORAL CARE MOUTH RINSE: 15.0000 mL | Freq: Once | OROMUCOSAL | Status: AC

## 2022-11-01 MED ORDER — MENTHOL 3 MG MT LOZG: 1.0000 | LOZENGE | OROMUCOSAL | Status: DC | PRN

## 2022-11-01 MED ORDER — LACTATED RINGERS IV SOLN: INTRAVENOUS | Status: DC

## 2022-11-01 MED ORDER — ACETAMINOPHEN 10 MG/ML IV SOLN
INTRAVENOUS | Status: DC | PRN
  Administered 2022-11-01: 1000 mg via INTRAVENOUS

## 2022-11-01 MED ORDER — KETAMINE HCL 50 MG/5ML IJ SOSY
PREFILLED_SYRINGE | INTRAMUSCULAR | Status: AC
  Filled 2022-11-01: qty 5

## 2022-11-01 MED ORDER — CEFAZOLIN SODIUM-DEXTROSE 2-4 GM/100ML-% IV SOLN
2.0000 g | INTRAVENOUS | Status: AC
  Administered 2022-11-01: 2 g via INTRAVENOUS
  Filled 2022-11-01: qty 100

## 2022-11-01 MED ORDER — POLYETHYLENE GLYCOL 3350 17 G PO PACK: 17.0000 g | PACK | Freq: Every day | ORAL | Status: DC | PRN

## 2022-11-01 MED ORDER — ONDANSETRON HCL 4 MG/2ML IJ SOLN
INTRAMUSCULAR | Status: AC
  Filled 2022-11-01: qty 2

## 2022-11-01 MED ORDER — ACETAMINOPHEN 160 MG/5ML PO SOLN: 325.0000 mg | ORAL | Status: DC | PRN

## 2022-11-01 MED ORDER — BUPIVACAINE HCL (PF) 0.5 % IJ SOLN
INTRAMUSCULAR | Status: DC | PRN
  Administered 2022-11-01: 15 mL via PERINEURAL

## 2022-11-01 MED ORDER — STERILE WATER FOR IRRIGATION IR SOLN
Status: DC | PRN
  Administered 2022-11-01: 2000 mL

## 2022-11-01 MED ORDER — OXYCODONE HCL 5 MG PO TABS: 5.0000 mg | ORAL_TABLET | Freq: Once | ORAL | Status: DC | PRN

## 2022-11-01 MED ORDER — MIDAZOLAM HCL 5 MG/5ML IJ SOLN
INTRAMUSCULAR | Status: DC | PRN
  Administered 2022-11-01: 2 mg via INTRAVENOUS

## 2022-11-01 MED ORDER — MAGNESIUM CITRATE PO SOLN: 1.0000 | Freq: Once | ORAL | Status: DC | PRN

## 2022-11-01 MED ORDER — PROPOFOL 10 MG/ML IV BOLUS
INTRAVENOUS | Status: AC
  Filled 2022-11-01: qty 20

## 2022-11-01 MED ORDER — TRAZODONE HCL 100 MG PO TABS: 100.0000 mg | ORAL_TABLET | Freq: Every evening | ORAL | Status: DC | PRN

## 2022-11-01 MED ORDER — VANCOMYCIN HCL 1250 MG/250ML IV SOLN
1250.0000 mg | Freq: Two times a day (BID) | INTRAVENOUS | Status: DC
  Administered 2022-11-01 – 2022-11-02 (×2): 1250 mg via INTRAVENOUS
  Filled 2022-11-01 (×2): qty 250

## 2022-11-01 MED ORDER — FENTANYL CITRATE (PF) 100 MCG/2ML IJ SOLN
INTRAMUSCULAR | Status: DC | PRN
  Administered 2022-11-01 (×2): 100 ug via INTRAVENOUS

## 2022-11-01 MED ORDER — OXYCODONE HCL 5 MG/5ML PO SOLN: 5.0000 mg | Freq: Once | ORAL | Status: DC | PRN

## 2022-11-01 MED ORDER — LIDOCAINE HCL (PF) 2 % IJ SOLN
INTRAMUSCULAR | Status: AC
  Filled 2022-11-01: qty 5

## 2022-11-01 MED ORDER — PHENOL 1.4 % MT LIQD: 1.0000 | OROMUCOSAL | Status: DC | PRN

## 2022-11-01 MED ORDER — BUPIVACAINE HCL (PF) 0.5 % IJ SOLN: INTRAMUSCULAR | Status: DC | PRN

## 2022-11-01 MED ORDER — MEPERIDINE HCL 50 MG/ML IJ SOLN: 6.2500 mg | INTRAMUSCULAR | Status: DC | PRN

## 2022-11-01 MED ORDER — FENTANYL CITRATE PF 50 MCG/ML IJ SOSY: 25.0000 ug | PREFILLED_SYRINGE | INTRAMUSCULAR | Status: DC | PRN

## 2022-11-01 MED ORDER — ACETAMINOPHEN 500 MG PO TABS
1000.0000 mg | ORAL_TABLET | Freq: Four times a day (QID) | ORAL | Status: DC
  Administered 2022-11-01 – 2022-11-02 (×3): 1000 mg via ORAL
  Filled 2022-11-01 (×4): qty 2

## 2022-11-01 MED ORDER — 0.9 % SODIUM CHLORIDE (POUR BTL) OPTIME
TOPICAL | Status: DC | PRN
  Administered 2022-11-01: 1000 mL

## 2022-11-01 MED ORDER — METOCLOPRAMIDE HCL 5 MG/ML IJ SOLN: 5.0000 mg | Freq: Three times a day (TID) | INTRAMUSCULAR | Status: DC | PRN

## 2022-11-01 MED ORDER — METOCLOPRAMIDE HCL 5 MG PO TABS: 5.0000 mg | ORAL_TABLET | Freq: Three times a day (TID) | ORAL | Status: DC | PRN

## 2022-11-01 MED ORDER — VANCOMYCIN HCL 1750 MG/350ML IV SOLN
1750.0000 mg | INTRAVENOUS | Status: DC
Start: 2022-11-01 — End: 2022-11-01

## 2022-11-01 MED ORDER — SUGAMMADEX SODIUM 200 MG/2ML IV SOLN
INTRAVENOUS | Status: DC | PRN
  Administered 2022-11-01: 180 mg via INTRAVENOUS

## 2022-11-01 MED ORDER — ACETAMINOPHEN 325 MG PO TABS: 325.0000 mg | ORAL_TABLET | ORAL | Status: DC | PRN

## 2022-11-01 MED ORDER — ONDANSETRON HCL 4 MG PO TABS: 4.0000 mg | ORAL_TABLET | Freq: Four times a day (QID) | ORAL | Status: DC | PRN

## 2022-11-01 MED ORDER — SODIUM CHLORIDE 0.9% FLUSH: 10.0000 mL | INTRAVENOUS | Status: DC | PRN

## 2022-11-01 MED ORDER — DEXAMETHASONE SODIUM PHOSPHATE 10 MG/ML IJ SOLN
INTRAMUSCULAR | Status: DC | PRN
  Administered 2022-11-01: 6 mg via INTRAVENOUS

## 2022-11-01 MED ORDER — KETAMINE HCL 10 MG/ML IJ SOLN
INTRAMUSCULAR | Status: DC | PRN
  Administered 2022-11-01: 10 mg via INTRAVENOUS
  Administered 2022-11-01: 20 mg via INTRAVENOUS

## 2022-11-01 MED ORDER — ACETAMINOPHEN 10 MG/ML IV SOLN
INTRAVENOUS | Status: AC
  Filled 2022-11-01: qty 100

## 2022-11-01 MED ORDER — ONDANSETRON HCL 4 MG/2ML IJ SOLN: 4.0000 mg | Freq: Four times a day (QID) | INTRAMUSCULAR | Status: DC | PRN

## 2022-11-01 MED ORDER — PHENYLEPHRINE HCL-NACL 20-0.9 MG/250ML-% IV SOLN
INTRAVENOUS | Status: DC | PRN
  Administered 2022-11-01: 25 ug/min via INTRAVENOUS

## 2022-11-01 SURGICAL SUPPLY — 97 items
AID PSTN UNV HD RSTRNT DISP (MISCELLANEOUS) ×1
APL PRP STRL LF DISP 70% ISPRP (MISCELLANEOUS) ×2
BAG COUNTER SPONGE SURGICOUNT (BAG) ×1 IMPLANT
BAG SPNG CNTER NS LX DISP (BAG) ×1
BASEPLATE GLENOID RSA 3X25 0D (Shoulder) IMPLANT
BIT DRILL 3.2 PERIPHERAL SCREW (BIT) IMPLANT
BLADE SAW SAG 29X58X.64 (BLADE) IMPLANT
BLADE SAW SGTL 73X25 THK (BLADE) IMPLANT
BLADE SAW SGTL 81X20 HD (BLADE) ×1 IMPLANT
BODY PROXIMAL PTC 13X132.5 (Joint) IMPLANT
BRUSH FEMORAL CANAL (MISCELLANEOUS) IMPLANT
BSPLAT GLND +3 25 (Shoulder) ×1 IMPLANT
CAP LOCKING COCR (Cap) IMPLANT
CEMENT BONE DEPUY (Cement) IMPLANT
CHLORAPREP W/TINT 26 (MISCELLANEOUS) ×2 IMPLANT
CNTNR URN SCR LID CUP LEK RST (MISCELLANEOUS) ×3 IMPLANT
CONT SPEC 4OZ STRL OR WHT (MISCELLANEOUS) ×3
COOLER ICEMAN CLASSIC (MISCELLANEOUS) ×1 IMPLANT
COVER BACK TABLE 60X90IN (DRAPES) IMPLANT
COVER SURGICAL LIGHT HANDLE (MISCELLANEOUS) ×1 IMPLANT
DRAPE C-ARM 42X120 X-RAY (DRAPES) IMPLANT
DRAPE INCISE IOBAN 66X45 STRL (DRAPES) ×1 IMPLANT
DRAPE ORTHO SPLIT 77X108 STRL (DRAPES) ×2
DRAPE POUCH INSTRU U-SHP 10X18 (DRAPES) ×1 IMPLANT
DRAPE SHEET LG 3/4 BI-LAMINATE (DRAPES) ×1 IMPLANT
DRAPE SURG ORHT 6 SPLT 77X108 (DRAPES) ×2 IMPLANT
DRAPE TOP 10253 STERILE (DRAPES) ×1 IMPLANT
DRAPE U-SHAPE 47X51 STRL (DRAPES) ×1 IMPLANT
DRSG AQUACEL AG ADV 3.5X 6 (GAUZE/BANDAGES/DRESSINGS) ×1 IMPLANT
DRSG AQUACEL AG ADV 3.5X10 (GAUZE/BANDAGES/DRESSINGS) IMPLANT
ELECT BLADE TIP CTD 4 INCH (ELECTRODE) ×1 IMPLANT
ELECT NDL TIP 2.8 STRL (NEEDLE) IMPLANT
ELECT NEEDLE TIP 2.8 STRL (NEEDLE) IMPLANT
ELECT REM PT RETURN 15FT ADLT (MISCELLANEOUS) ×1 IMPLANT
EVACUATOR 1/8 PVC DRAIN (DRAIN) IMPLANT
FACESHIELD WRAPAROUND (MASK) ×3 IMPLANT
FACESHIELD WRAPAROUND OR TEAM (MASK) ×3 IMPLANT
GAUZE SPONGE 4X4 12PLY STRL (GAUZE/BANDAGES/DRESSINGS) ×1 IMPLANT
GAUZE XEROFORM 1X8 LF (GAUZE/BANDAGES/DRESSINGS) ×1 IMPLANT
GLENOSPHERE REV SHOULDER 36 (Joint) IMPLANT
GLOVE BIO SURGEON STRL SZ 6.5 (GLOVE) ×1 IMPLANT
GLOVE BIOGEL PI IND STRL 6.5 (GLOVE) ×1 IMPLANT
GLOVE BIOGEL PI IND STRL 8 (GLOVE) ×1 IMPLANT
GLOVE ECLIPSE 8.0 STRL XLNG CF (GLOVE) ×1 IMPLANT
GOWN STRL REUS W/ TWL LRG LVL3 (GOWN DISPOSABLE) ×1 IMPLANT
GOWN STRL REUS W/ TWL XL LVL3 (GOWN DISPOSABLE) ×1 IMPLANT
GOWN STRL REUS W/TWL LRG LVL3 (GOWN DISPOSABLE) ×1
GOWN STRL REUS W/TWL XL LVL3 (GOWN DISPOSABLE) ×1
GUIDEWIRE GLENOID 2.5X220 (WIRE) IMPLANT
HANDPIECE INTERPULSE COAX TIP (DISPOSABLE) ×1
IMPL REVERSE SHOULDER 0X3.5 (Shoulder) IMPLANT
IMPLANT REVERSE SHOULDER 0X3.5 (Shoulder) ×1 IMPLANT
INSERT HUM REVERSE 36 +9 B (Insert) IMPLANT
KIT BASIN OR (CUSTOM PROCEDURE TRAY) ×1 IMPLANT
KIT STABILIZATION SHOULDER (MISCELLANEOUS) ×1 IMPLANT
KIT TURNOVER KIT A (KITS) IMPLANT
MANIFOLD NEPTUNE II (INSTRUMENTS) ×1 IMPLANT
NDL 1/2 CIR CATGUT .05X1.09 (NEEDLE) IMPLANT
NEEDLE 1/2 CIR CATGUT .05X1.09 (NEEDLE) IMPLANT
NS IRRIG 1000ML POUR BTL (IV SOLUTION) ×1 IMPLANT
PACK SHOULDER (CUSTOM PROCEDURE TRAY) ×1 IMPLANT
PAD COLD SHLDR WRAP-ON (PAD) ×1 IMPLANT
PROXIMAL BODY PTC 13X132.5 (Joint) ×1 IMPLANT
RESTRAINT HEAD UNIVERSAL NS (MISCELLANEOUS) ×1 IMPLANT
RETRIEVER SUT HEWSON (MISCELLANEOUS) IMPLANT
SCREW 5.0X18 (Screw) IMPLANT
SCREW BONE 6.5 OD 30 NON BIO (Screw) IMPLANT
SCREW PERIPHERAL 30 (Screw) IMPLANT
SCREW PERIPHERAL 42 (Screw) IMPLANT
SCREW SHLD ASSEMBLY AEQ 20 (Screw) IMPLANT
SET HNDPC FAN SPRY TIP SCT (DISPOSABLE) ×1 IMPLANT
SLING ARM FOAM STRAP MED (SOFTGOODS) IMPLANT
SLING ARM FOAM STRAP XLG (SOFTGOODS) IMPLANT
SLING ARM IMMOBILIZER LRG (SOFTGOODS) IMPLANT
SLING ARM IMMOBILIZER MED (SOFTGOODS) IMPLANT
SMARTMIX MINI TOWER (MISCELLANEOUS)
SOLUTION PRONTOSAN WOUND 350ML (IRRIGATION / IRRIGATOR) ×1 IMPLANT
SPACER SHLD CMT AEQ 13X20 (Shoulder) IMPLANT
SPONGE T-LAP 4X18 ~~LOC~~+RFID (SPONGE) IMPLANT
STEM HUM PTC DISTAL 13X130 (Miscellaneous) IMPLANT
SUPPORT WRAP ARM LG (MISCELLANEOUS) ×1 IMPLANT
SUT ETHIBOND 2 V 37 (SUTURE) ×1 IMPLANT
SUT ETHILON 3 0 FSL (SUTURE) ×1 IMPLANT
SUT FIBERWIRE #2 38 T-5 BLUE (SUTURE)
SUT FIBERWIRE #5 38 CONV NDL (SUTURE) ×5
SUT MNCRL AB 4-0 PS2 18 (SUTURE) IMPLANT
SUT VIC AB 0 CT1 27 (SUTURE) ×1
SUT VIC AB 0 CT1 27XBRD ANBCTR (SUTURE) ×1 IMPLANT
SUT VIC AB 3-0 SH 27 (SUTURE) ×1
SUT VIC AB 3-0 SH 27X BRD (SUTURE) ×1 IMPLANT
SUTURE FIBERWR #2 38 T-5 BLUE (SUTURE) IMPLANT
SUTURE FIBERWR #5 38 CONV NDL (SUTURE) IMPLANT
TOWEL OR 17X26 10 PK STRL BLUE (TOWEL DISPOSABLE) ×1 IMPLANT
TOWEL OR NON WOVEN STRL DISP B (DISPOSABLE) ×1 IMPLANT
TOWER SMARTMIX MINI (MISCELLANEOUS) IMPLANT
TUBE SUCTION HIGH CAP CLEAR NV (SUCTIONS) ×1 IMPLANT
WATER STERILE IRR 1000ML POUR (IV SOLUTION) ×1 IMPLANT

## 2022-11-01 NOTE — Progress Notes (Signed)
7/18 Vancomycin trough 13.5 goal trough 15-20, dosing per pharmacy 7/22 CBC and BMP unremarkable, ESR 6, CRP < 1

## 2022-11-01 NOTE — Anesthesia Procedure Notes (Addendum)
Anesthesia Regional Block: Interscalene brachial plexus block   Pre-Anesthetic Checklist: , timeout performed,  Correct Patient, Correct Site, Correct Laterality,  Correct Procedure, Correct Position, site marked,  Risks and benefits discussed,  Surgical consent,  Pre-op evaluation,  At surgeon's request and post-op pain management  Laterality: Right  Prep: chloraprep       Needles:  Injection technique: Single-shot  Needle Type: Echogenic Stimulator Needle     Needle Length: 5cm  Needle Gauge: 22     Additional Needles:   Procedures:, nerve stimulator,,, ultrasound used (permanent image in chart),,     Nerve Stimulator or Paresthesia:  Response: hand, 0.45 mA  Additional Responses:   Narrative:  Start time: 11/01/2022 6:55 AM End time: 11/01/2022 7:11 AM Injection made incrementally with aspirations every 5 mL.  Performed by: Personally  Anesthesiologist: Bethena Midget, MD  Additional Notes: Functioning IV was confirmed and monitors were applied.  A 50mm 22ga Arrow echogenic stimulator needle was used. Sterile prep and drape,hand hygiene and sterile gloves were used. Ultrasound guidance: relevant anatomy identified, needle position confirmed, local anesthetic spread visualized around nerve(s)., vascular puncture avoided.  Image printed for medical record. Negative aspiration and negative test dose prior to incremental administration of local anesthetic. The patient tolerated the procedure well.

## 2022-11-01 NOTE — Anesthesia Procedure Notes (Signed)
Procedure Name: Intubation Date/Time: 11/01/2022 7:35 AM  Performed by: Elisabeth Cara, CRNAPre-anesthesia Checklist: Patient identified, Emergency Drugs available, Suction available, Patient being monitored and Timeout performed Patient Re-evaluated:Patient Re-evaluated prior to induction Oxygen Delivery Method: Circle system utilized Preoxygenation: Pre-oxygenation with 100% oxygen Induction Type: IV induction Ventilation: Mask ventilation without difficulty Laryngoscope Size: Mac and 4 Grade View: Grade I Tube type: Oral Tube size: 7.5 mm Number of attempts: 1 Airway Equipment and Method: Stylet Placement Confirmation: ETT inserted through vocal cords under direct vision, positive ETCO2 and breath sounds checked- equal and bilateral Secured at: 22 cm Tube secured with: Tape Dental Injury: Teeth and Oropharynx as per pre-operative assessment

## 2022-11-01 NOTE — Transfer of Care (Signed)
Immediate Anesthesia Transfer of Care Note  Patient: Arelia Sneddon  Procedure(s) Performed: TOTAL SHOULDER REVISION (Right: Shoulder)  Patient Location: PACU  Anesthesia Type:General  Level of Consciousness: awake, alert , oriented, and patient cooperative  Airway & Oxygen Therapy: Patient Spontanous Breathing and Patient connected to face mask oxygen  Post-op Assessment: Report given to RN, Post -op Vital signs reviewed and stable, and Patient moving all extremities  Post vital signs: Reviewed and stable  Last Vitals:  Vitals Value Taken Time  BP 164/96 11/01/22 0946  Temp    Pulse 85 11/01/22 0949  Resp 16 11/01/22 0949  SpO2 100 % 11/01/22 0949  Vitals shown include unfiled device data.  Last Pain:  Vitals:   11/01/22 0617  TempSrc: Oral         Complications: No notable events documented.

## 2022-11-01 NOTE — Op Note (Signed)
Orthopaedic Surgery Operative Note (CSN: [id number])  Dawn Chen  06/22/1960 Date of Surgery: 11/01/2022   Diagnoses:  Right shoulder infection with failed hardware, loose spacer  Procedure: Right revision reverse total Shoulder Arthroplasty   Operative Finding Successful completion of planned procedure.  Patient had fracture through her lateral wall.  The spacer itself was intact.  Joint looks much healthier than it did at the time of her initial surgery.  Bone graft incorporating well into the glenoid.  We had good fixation with a long stemmed diaphyseal fit implant.  There was a reasonable repair through the stem greater tuberosity fragment and some anterior tissue that was repaired for stability purposes.  Patient will hold on therapy for 4 weeks and continue IV antibiotics per infectious disease.  Post-operative plan: The patient will be NWB in sling.  The patient will be will be admitted to observation due to medical complexity, monitoring and pain management.  DVT prophylaxis Aspirin 81 mg twice daily for 6 weeks.  Pain control with PRN pain medication preferring oral medicines.  Follow up plan will be scheduled in approximately 7 days for incision check and XR.  Physical therapy to start after 4 weeks.  Implants: Tornier revive stem 13x130 millimeter with 20 mm spacer, plate And screw, proximal body is 13 mm, high offset 0 tray with a +9 retentive polyethylene.  36 standard glenosphere with a 25+3 baseplate, 35 center screw and 3 peripheral screws  Post-Op Diagnosis: Same Surgeons:Primary: Bjorn Pippin, MD Assistants:Caroline McBane PA-C Location: [facility] ROOM 06 Anesthesia: General with Exparel Interscalene Antibiotics: Ancef 2g preop, Vancomycin 1000mg  locally Tourniquet time: None Estimated Blood Loss: 200 Complications: None Specimens: None Implants: Implant Name Type Inv. Item Serial No. Manufacturer Lot No. LRB No. Used Action  BSPLAT GLND +3 25 - [id number] Shoulder  BSPLAT GLND +3 25  [employer] [id number] Right 1 Implanted  GLENOSPHERE REV SHOULDER 36 - [id number] Joint GLENOSPHERE REV SHOULDER 36  [employer] [id number] Right 1 Implanted  SCREW BONE 6.5 OD 30 NON BIO - [id number] Screw SCREW BONE 6.5 OD 30 NON BIO  [employer]  Right 1 Implanted  SCREW PERIPHERAL 30 - [id number] Screw SCREW PERIPHERAL 30  [employer]  Right 1 Implanted  SCREW 5.0X18 - [id number] Screw SCREW 5.0X18  [employer]  Right 1 Implanted  SCREW 5.0X18 - [id number] Screw SCREW 5.0X18  [employer]  Right 1 Implanted  SPACER SHLD CMT AEQ 13X20 - [id number] Shoulder SPACER SHLD CMT AEQ 13X20  [employer] [id number] Right 1 Implanted  STEM HUM PTC DISTAL 13X130 - [id number] Miscellaneous STEM HUM PTC DISTAL 13X130  [employer] [id number] Right 1 Implanted  PROXIMAL BODY PTC 13X132.5 - [id number] Joint PROXIMAL BODY PTC 13X132.5  [employer] [id number] Right 1 Implanted  SCREW SHLD ASSEMBLY AEQ 20 - [id number] Screw SCREW SHLD ASSEMBLY AEQ 20  [employer] [id number] Right 1 Implanted  CAP LOCKING COCR - [id number] Cap CAP LOCKING COCR  [employer] [id number] Right 1 Implanted  IMPLANT REVERSE SHOULDER 0X3.5 - [id number] Shoulder IMPLANT REVERSE SHOULDER 0X3.5  [employer] [id number] Right 1 Implanted  INSERT HUM REVERSE 36 +9 B - [id number] Insert INSERT HUM REVERSE 36 +9 B  [employer] [id number] Right 1 Implanted    Indications for Surgery:   Dawn Chen is a 62 y.o. female with previous right shoulder explantation in the setting of gross infection.  We have placed an antibiotic spacer however unfortunately the patient had a fracture while using the arm after  surgery.  Based on this I felt that it is appropriate to move quickly to reimplantation despite the infection as she had already been on a month of IV antibiotics.  Waiting longer could have led to significant bone loss and soft tissue deterioration.  Benefits and risks of operative and nonoperative management were discussed  prior to surgery with patient/guardian(s) and informed consent form was completed.  Infection and need for further surgery were discussed as was prosthetic stability and cuff issues.  We additionally specifically discussed risks of axillary nerve injury, infection, periprosthetic fracture, continued pain and longevity of implants prior to beginning procedure.      Procedure:   The patient was identified in the preoperative holding area where the surgical site was marked. Block placed by anesthesia with exparel.  The patient was taken to the OR where a procedural timeout was called and the above noted anesthesia was induced.  The patient was positioned beachchair on allen table with spider arm positioner.  Preoperative antibiotics were dosed.  The patient's right shoulder was prepped and draped in the usual sterile fashion.  A second preoperative timeout was called.       We began by removing the sutures from the previous surgery.  We went through the previous incision extending about a centimeter distally going through skin sharply achieving hemostasis we progressed.  Once we had identified the deep layers we went back through the interval tissue between the deltoid and the pectoralis.  At that point we were able to expose the joint.  A small amount of serous and serosanguineous fluid was encountered but no purulence was noted.  We performed a synovectomy of the joint and remove the previous spacer.  The fragmentation of the greater tuberosity was identified and mobilized and stay sutures were placed in this as well as the remnant of the anterior capsule/subscapularis remnant.  We were able to remove the spacer and irrigate and debride the humeral canal.  Once we are able to ream and sound we encountered that we would have good fit with a longstem diaphyseal fill implant.  We then turned our attention to the glenoid.   Even at 4 weeks post implantation of bone graft the glenoid bone graft incorporated  quite well.  We are able to place a 25 mm baseplate guide and placed our center pin in appropriate position.  We then drilled and placed a 25+3 baseplate with a 35 x 6.5 mm center screw and 3 peripheral locking screw lengths omitting the posterior screw which obtained no purchase.  We cleared peripheral bone and placed a 36 standard glenosphere.  Turned attention back to the humerus.  The humerus was sounded again and we constructed a trial implant of 150 mm.  We had good stability with this trial construct.  We placed a vertical stabilization sutures and four #5 FiberWire's in the greater tuberosity fragment.  We placed these through the stem and were able to place our final implant with good fixation and fit.  There was rotational stability.  We used the sutures that were passed through the stem to repair the greater tuberosity to the stem first and the remaining 3 sutures were passed through the subscapularis remnant to tie the tissue to the anterior portion of the stem primarily for stability purposes.  The construct was quite stable.  Were happy with the overall stability of the shoulder.  Placed Prontosan solution by technique and irrigated with 3 L of saline.  A medium Hemovac was placed.  At that point we were able to close the incision in multilayer fashion fashion with nylon suture.  Sterile dressing and sling were placed.  Patient woken taken to PACU in stable condition.   Alfonse Alpers, PA-C, present and scrubbed throughout the case, critical for completion in a timely fashion, and for retraction, instrumentation, closure.

## 2022-11-01 NOTE — Progress Notes (Signed)
7/10 WBC 6.6, hemoglobin 10.3, platelets 618, creatinine 0.6, ESR 21, CRP 1

## 2022-11-01 NOTE — Progress Notes (Signed)
Pharmacy Antibiotic Note  Dawn Chen is a 62 y.o. 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 She underwent surgery 6/26 with removal of total shoulder arthroplasty and synovectomy, right hemiarthroplasty w/ antibiotic impregnated implant. Purulence noted in the OR as well as cavitary lesion in the glenoid that was bone grafted. She was started on daptomycin and ceftriaxone at that time and ultimately discharged home on IV antibiotics with planned length of therapy through 8/6. On 7/9, IV antibiotics were changed to vancomycin based on culture results. Patient did not wish to continue with PICC s/t limitations but ultimately agreed to complete IV antibiotic course through 11/29/22 with plan for oral antibiotics after that. Pharmacy has been consulted to continue vancomycin dosing.  Per Jeri Modena with [facility], patient was started on vancomycin 1000 mg q12h which was recently increased to 1250 mg q12h based on outpatient trough level.  Plan: -Continue vancomycin 1250 mg IV q12h per home regimen -Follow renal function, cultures and clinical progress -Will follow for need to check level here vs outpatient depending on length of stay  Height: 5\' 8"  (172.7 cm) Weight: 69.9 kg (154 lb) IBW/kg (Calculated) : 63.9  Temp (24hrs), Avg:97.9 F (36.6 C), Min:97.7 F (36.5 C), Max:98.1 F (36.7 C)  Recent Labs  Lab 10/25/22 1328  WBC 6.3  CREATININE 0.64    Estimated Creatinine Clearance: 74.5 mL/min (by C-G formula based on SCr of 0.64 mg/dL).    Allergies  Allergen Reactions   Diflucan [Fluconazole] Other (See Comments)    Headache    Gabapentin     Pass out    Hydrochlorothiazide Other (See Comments)    Dizzy   Methadone Nausea And Vomiting   Pregabalin Other (See Comments)    Unable to function    Antimicrobials this admission: Vancomycin 7/9 >> Daptomycin 6/26 >> 7/9 Ceftriaxone 6/26 >> 7/9  Dose adjustments this admission:  NA  Microbiology results: 6/26 Cx (tissue right shoulder): corynebacterium, group JK   Thank you for allowing pharmacy to be a part of this patient's care.  Pricilla Riffle, PharmD, BCPS Clinical Pharmacist 11/01/2022 1:15 PM

## 2022-11-01 NOTE — Interval H&P Note (Signed)
All questions answered, patient wants to proceed with procedure. ? ?

## 2022-11-01 NOTE — Progress Notes (Deleted)
7/10 WBC 6.6, hemoglobin 10.3, platelets 618, creatinine 0.6, ESR 21, CRP 1

## 2022-11-02 MED ORDER — NAPROXEN 500 MG PO TABS: 500.0000 mg | ORAL_TABLET | Freq: Two times a day (BID) | ORAL | 0 refills | Status: AC | PRN

## 2022-11-02 MED ORDER — HEPARIN SOD (PORK) LOCK FLUSH 100 UNIT/ML IV SOLN
250.0000 [IU] | INTRAVENOUS | Status: AC | PRN
  Administered 2022-11-02: 250 [IU]

## 2022-11-02 MED ORDER — ONDANSETRON HCL 4 MG PO TABS: 4.0000 mg | ORAL_TABLET | Freq: Three times a day (TID) | ORAL | 0 refills | Status: AC | PRN

## 2022-11-02 MED ORDER — ASPIRIN 81 MG PO CHEW: 81.0000 mg | CHEWABLE_TABLET | Freq: Two times a day (BID) | ORAL | 0 refills | Status: AC

## 2022-11-02 MED ORDER — ACETAMINOPHEN 500 MG PO TABS: 1000.0000 mg | ORAL_TABLET | Freq: Three times a day (TID) | ORAL | 0 refills | Status: AC

## 2022-11-02 MED ORDER — METHOCARBAMOL 750 MG PO TABS: 750.0000 mg | ORAL_TABLET | Freq: Three times a day (TID) | ORAL | 0 refills | Status: AC | PRN

## 2022-11-02 NOTE — Progress Notes (Signed)
   11/02/22 0927  TOC Brief Assessment  Insurance and Status Reviewed  Patient has primary care physician Yes  Home environment has been reviewed Lives alone  Prior level of function: Independent at baseline  Prior/Current Home Services No current home services  Social Determinants of Health Reivew SDOH reviewed no interventions necessary  Readmission risk has been reviewed Yes  Transition of care needs no transition of care needs at this time

## 2022-11-02 NOTE — Progress Notes (Signed)
   ORTHOPAEDIC PROGRESS NOTE  s/p Procedure(s): TOTAL SHOULDER REVISION  SUBJECTIVE: Reports mild to moderate pain about operative site.   No chest pain. No SOB. No nausea/vomiting. No other complaints.  OBJECTIVE: PE: General: sitting up in hospital bed, NAD Cardiac: regular rate Pulmonary: no increased work of breathing RUE: no drainage noted in hemovac, hemovac removed, had some bloody drainage from drain site which was redressed with gauze and paper tape, aquacel CDI, sling well fitting,  full and painless ROM throughout hand with [facility] of 0. + Motor in  AIN, PIN, Ulnar distributions. She states axillary nerve sensation preserved and symmetric.  Sensation intact in medial, radial, and ulnar distributions. Well perfused digits.    Vitals:   11/02/22 0125 11/02/22 0533  BP: (!) 141/82 (!) 148/88  Pulse: 82 87  Resp: 17 16  Temp: 98.3 F (36.8 C) 98.1 F (36.7 C)  SpO2: 100% 100%   Stable post-op images.   ASSESSMENT: Dawn Chen is a 62 y.o. female doing well postoperatively. POD#1  PLAN: Weightbearing: NWB RUE Insicional and dressing care: Reinforce drain dressing PRN. Aquacel reinforce as needed but okay to leave in place as long as no drainage Orthopedic device(s): Sling Showering: Post-op day #2 VTE prophylaxis: Aspirin 81mg  BID  Pain control: PRN pain medication, minimize narcotics as able Follow - up plan: 1 week in office Dispo: Discharge home today Contact information:  Weekdays 8-5 Dr. Ramond Marrow, Alfonse Alpers PA-C, After hours and holidays please check [email or web address] for group call information for [facility]  Alfonse Alpers, PA-C 11/02/2022

## 2022-11-02 NOTE — Plan of Care (Signed)
  Problem: Education: Goal: Knowledge of the prescribed therapeutic regimen will improve Outcome: Adequate for Discharge Goal: Understanding of activity limitations/precautions following surgery will improve Outcome: Adequate for Discharge Goal: Individualized Educational Video(s) Outcome: Adequate for Discharge   Problem: Activity: Goal: Ability to tolerate increased activity will improve Outcome: Adequate for Discharge   Problem: Pain Management: Goal: Pain level will decrease with appropriate interventions Outcome: Adequate for Discharge   Problem: Education: Goal: Knowledge of General Education information will improve Description: Including pain rating scale, medication(s)/side effects and non-pharmacologic comfort measures Outcome: Adequate for Discharge   Problem: Health Behavior/Discharge Planning: Goal: Ability to manage health-related needs will improve Outcome: Adequate for Discharge   Problem: Clinical Measurements: Goal: Ability to maintain clinical measurements within normal limits will improve Outcome: Adequate for Discharge Goal: Will remain free from infection Outcome: Adequate for Discharge Goal: Diagnostic test results will improve Outcome: Adequate for Discharge Goal: Respiratory complications will improve Outcome: Adequate for Discharge Goal: Cardiovascular complication will be avoided Outcome: Adequate for Discharge   Problem: Activity: Goal: Risk for activity intolerance will decrease Outcome: Adequate for Discharge   Problem: Nutrition: Goal: Adequate nutrition will be maintained Outcome: Adequate for Discharge   Problem: Coping: Goal: Level of anxiety will decrease Outcome: Adequate for Discharge   Problem: Elimination: Goal: Will not experience complications related to bowel motility Outcome: Adequate for Discharge Goal: Will not experience complications related to urinary retention Outcome: Adequate for Discharge   Problem: Pain  Managment: Goal: General experience of comfort will improve Outcome: Adequate for Discharge   Problem: Safety: Goal: Ability to remain free from injury will improve Outcome: Adequate for Discharge   Problem: Skin Integrity: Goal: Risk for impaired skin integrity will decrease Outcome: Adequate for Discharge   

## 2022-11-02 NOTE — Discharge Instructions (Signed)
Ramond Marrow MD, MPH Alfonse Alpers, PA-C [facility] [address], [address] [phone] (tel)   [phone] (fax)   POST-OPERATIVE INSTRUCTIONS - TOTAL SHOULDER REPLACEMENT    WOUND CARE You may leave the operative dressing in place until your follow-up appointment. KEEP THE INCISIONS CLEAN AND DRY. There may be a small amount of fluid/bleeding leaking at the surgical site. This is normal after surgery.  If it fills with liquid or blood please call us immediately to change it for you. Use the provided ice machine or Ice packs as often as possible for the first 3-4 days, then as needed for pain relief.   Keep a layer of cloth or a shirt between your skin and the cooling unit to prevent frost bite as it can get very cold.  SHOWERING: - You may shower on Post-Op Day #2.  - The dressing is water resistant but do not scrub it as it may start to peel up.   - You may remove the sling for showering - Gently pat the area dry.  - Do not soak the shoulder in water.  - Do not go swimming in the pool or ocean until your incision has completely healed (about 4-6 weeks after surgery) - KEEP THE INCISIONS CLEAN AND DRY.  Drain dressing:  - You will want to remove the gauze and paper tape before showering - You can replace it with more gauze and paper tape - This should not get wet - Once the area is no longer draining, you may leave it clean, dry, and open to air  EXERCISES Wear the sling at all times  You may remove the sling for showering, but keep the arm across the chest or in a secondary sling.    Accidental/Purposeful External Rotation and shoulder flexion (reaching behind you) is to be avoided at all costs for the first month. It is ok to come out of your sling if your are sitting and have assistance for eating.   Do not lift anything heavier than 1 pound until we discuss it further in clinic.  It is normal for your fingers/hand to become more swollen after  surgery and discolored from bruising.   This will resolve over the first few weeks usually after surgery. Please continue to ambulate and do not stay sitting or lying for too long.  Perform foot and wrist pumps to assist in circulation.  PHYSICAL THERAPY - No therapy for 4 weeks after surgery  REGIONAL ANESTHESIA (NERVE BLOCKS) The anesthesia team may have performed a nerve block for you this is a great tool used to minimize pain.   The block may start wearing off overnight (between 8-24 hours postop) When the block wears off, your pain may go from nearly zero to the pain you would have had postop without the block. This is an abrupt transition but nothing dangerous is happening.   This can be a challenging period but utilize your as needed pain medications to try and manage this period. We suggest you use the pain medication the first night prior to going to bed, to ease this transition.  You may take an extra dose of narcotic when this happens if needed   POST-OP MEDICATIONS- Multimodal approach to pain control In general your pain will be controlled with a combination of substances.  Prescriptions unless otherwise discussed are electronically sent to your pharmacy.  This is a carefully made plan we use to minimize narcotic use.     Naproxen -  Anti-inflammatory medication taken on a scheduled basis Acetaminophen - Non-narcotic pain medicine taken on a scheduled basis  Robaxin - this is a muscle relaxer, take as needed for muscle spasms Aspirin 81mg  - This medicine is used to minimize the risk of blood clots after surgery. Omeprazole - this is to protect your stomach while taking NSAIDs Zofran -  take as needed for nausea  Other: Oxycodone - This is a strong narcotic, to be used only on an "as needed" basis for SEVERE pain. You will continue oxycodone as prescribed by your pain management provider  FOLLOW-UP If you develop a Fever (>101.5), Redness or Drainage from the surgical  incision site, please call our office to arrange for an evaluation. Please call the office to schedule a follow-up appointment for a wound check, 7-10 days post-operatively.  IF YOU HAVE ANY QUESTIONS, PLEASE FEEL FREE TO CALL OUR OFFICE.  HELPFUL INFORMATION  Your arm will be in a sling following surgery. You will be in this sling for the next 4 weeks.   You may be more comfortable sleeping in a semi-seated position the first few nights following surgery.  Keep a pillow propped under the elbow and forearm for comfort.  If you have a recliner type of chair it might be beneficial.  If not that is fine too, but it would be helpful to sleep propped up with pillows behind your operated shoulder as well under your elbow and forearm.  This will reduce pulling on the suture lines.  When dressing, put your operative arm in the sleeve first.  When getting undressed, take your operative arm out last.  Loose fitting, button-down shirts are recommended.  In most states it is against the law to drive while your arm is in a sling. And certainly against the law to drive while taking narcotics.  You may return to work/school in the next couple of days when you feel up to it. Desk work and typing in the sling is fine.  We suggest you use the pain medication the first night prior to going to bed, in order to ease any pain when the anesthesia wears off. You should avoid taking pain medications on an empty stomach as it will make you nauseous.  You should wean off your narcotic medicines as soon as you are able.     Most patients will be off or using minimal narcotics before their first postop appointment.   Do not drink alcoholic beverages or take illicit drugs when taking pain medications.  Pain medication may make you constipated.  Below are a few solutions to try in this order: Decrease the amount of pain medication if you aren't having pain. Drink lots of decaffeinated fluids. Drink prune juice and/or  each dried prunes  If the first 3 don't work start with additional solutions Take Colace - an over-the-counter stool softener Take Senokot - an over-the-counter laxative Take Miralax - a stronger over-the-counter laxative   Dental Antibiotics:  In most cases prophylactic antibiotics for Dental procdeures after total joint surgery are not necessary.  Exceptions are as follows:  1. History of prior total joint infection  2. Severely immunocompromised (Organ Transplant, cancer chemotherapy, Rheumatoid biologic meds such as Humera)  3. Poorly controlled diabetes (A1C &gt; 8.0, blood glucose over 200)  If you have one of these conditions, contact your surgeon for an antibiotic prescription, prior to your dental procedure.   For more information including helpful videos and documents visit our website:   [email or web address]

## 2022-11-02 NOTE — Discharge Summary (Signed)
Patient ID: Dawn Chen MRN: [id number] DOB/AGE: 61-02-1961 61 y.o.  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  She tolerated procedure well.  She was kept for monitoring overnight for pain control, medical monitoring postop, and discharge planning. She was found to be stable for DC home the morning after surgery.  Patient was instructed on specific activity restrictions and all questions were answered.  Consults: None  Significant Diagnostic Studies: No additional pertinent studies  Treatments: Surgery  Discharge Exam: General: sitting up in hospital bed, NAD Cardiac: regular rate Pulmonary: no increased work of breathing RUE: no drainage noted in hemovac, hemovac removed, had some bloody drainage from drain site which was redressed with gauze and paper tape, aquacel CDI, sling well fitting,  full and painless ROM throughout hand with DPC of 0. + Motor in  AIN, PIN, Ulnar distributions. She states axillary nerve sensation preserved and symmetric.  Sensation intact in medial, radial, and ulnar distributions. Well perfused digits.   Disposition: Discharge disposition: 01-Home or Self Care       Discharge Instructions     Call MD for:  redness, tenderness, or signs of infection (pain,  swelling, redness, odor or green/yellow discharge around incision site)   Complete by: As directed    Call MD for:  severe uncontrolled pain   Complete by: As directed    Call MD for:  temperature >100.4   Complete by: As directed    Diet - low sodium heart healthy   Complete by: As directed       Allergies as of 11/02/2022       Reactions   Diflucan [fluconazole] Other (See Comments)   Headache   Gabapentin    Pass out    Hydrochlorothiazide Other (See Comments)   Dizzy   Methadone Nausea And Vomiting   Pregabalin Other (See Comments)   Unable to function        Medication List     STOP taking these medications    acetaminophen 650 MG suppository Commonly known as: TYLENOL Replaced by: acetaminophen 500 MG tablet       TAKE these medications    acetaminophen 500 MG tablet Commonly known as: TYLENOL Take 2 tablets (1,000 mg total) by mouth every 8 (eight) hours for 14 days. Replaces: acetaminophen 650 MG suppository   Amitiza 24 MCG capsule Generic drug: lubiprostone Take 24 mcg by mouth 2 (two) times daily.   aspirin 81 MG chewable tablet Commonly known as: Aspirin Childrens Chew 1 tablet (81 mg total) by mouth 2 (two) times daily. For 6 weeks for DVT prophylaxis after surgery What changed:  when to take this additional instructions   COLLAGEN PO Take 30 mLs by mouth daily.   COLLAGEN PO Take 3 capsules by mouth daily.  cyclobenzaprine 10 MG tablet Commonly known as: FLEXERIL Take 10 mg by mouth 2 (two) times daily.   diphenhydramine-acetaminophen 25-500 MG Tabs tablet Commonly known as: TYLENOL PM Take 2 tablets by mouth at bedtime.   methocarbamol 750 MG tablet Commonly known as: Robaxin-750 Take 1 tablet (750 mg total) by mouth every 8 (eight) hours as needed for muscle spasms.   naproxen 500 MG tablet Commonly known as: NAPROSYN Take 1 tablet (500 mg total) by mouth 2 (two) times daily as needed for moderate pain.   omeprazole 20 MG  capsule Commonly known as: PRILOSEC Take 1 capsule (20 mg total) by mouth daily. To gastric protection while taking NSAIDs What changed:  when to take this reasons to take this additional instructions   ondansetron 4 MG tablet Commonly known as: ZOFRAN Take 1 tablet (4 mg total) by mouth every 8 (eight) hours as needed for nausea or vomiting.   oxycodone 30 MG immediate release tablet Commonly known as: ROXICODONE Take 30 mg by mouth every 4 (four) hours as needed for pain.   Premarin vaginal cream Generic drug: conjugated estrogens Place 1 applicator vaginally daily as needed (dryness/irritation).   terbinafine 250 MG tablet Commonly known as: LamISIL Take 1 tablet (250 mg total) by mouth daily.   traZODone 100 MG tablet Commonly known as: DESYREL Take 100 mg by mouth at bedtime as needed for sleep.   triamcinolone cream 0.1 % Commonly known as: KENALOG Apply 1 Application topically daily as needed (rash).   vancomycin 10 G Solr injection Commonly known as: VANCOCIN Inject into the vein every 12 (twelve) hours. 1 dose injected every 12 hours   Vitamin D (Ergocalciferol) 1.25 MG (50000 UNIT) Caps capsule Commonly known as: DRISDOL Take 50,000 Units by mouth every 7 (seven) days.        Alfonse Alpers, PA-C 11/02/2022

## 2022-11-02 NOTE — Anesthesia Postprocedure Evaluation (Signed)
Anesthesia Post Note  Patient: Dawn Chen  Procedure(s) Performed: TOTAL SHOULDER REVISION (Right: Shoulder)     Patient location during evaluation: PACU Anesthesia Type: General Level of consciousness: awake and alert Pain management: pain level controlled Vital Signs Assessment: post-procedure vital signs reviewed and stable Respiratory status: spontaneous breathing, nonlabored ventilation, respiratory function stable and patient connected to nasal cannula oxygen Cardiovascular status: blood pressure returned to baseline and stable Postop Assessment: no apparent nausea or vomiting Anesthetic complications: no   No notable events documented.  Last Vitals:  Vitals:   11/02/22 0125 11/02/22 0533  BP: (!) 141/82 (!) 148/88  Pulse: 82 87  Resp: 17 16  Temp: 36.8 C 36.7 C  SpO2: 100% 100%    Last Pain:  Vitals:   11/02/22 0800  TempSrc:   PainSc: 0-No pain                 Halee Glynn

## 2022-11-02 NOTE — Plan of Care (Signed)
Discharge instructions given to the patient including medications and follow up.  

## 2022-11-08 ENCOUNTER — Telehealth: Payer: Self-pay | Admitting: Pharmacist

## 2022-11-08 NOTE — Telephone Encounter (Signed)
Patient's vancomycin trough has been low at 8.5 (7/22) and 7.7 (7/29) for the last two weeks. Spoke with [facility] pharmacist Amy Christell Constant about increasing vancomycin dosage which was done today. HH nursing states she has been inconsistent with dosing. Will complete remaining 7 days of treatment.   Margarite Gouge, PharmD, CPP, BCIDP, AAHIVP Clinical Pharmacist Practitioner Infectious Diseases Clinical Pharmacist [facility] Infectious Disease

## 2022-11-10 ENCOUNTER — Encounter (HOSPITAL_COMMUNITY): Payer: Self-pay | Admitting: Orthopaedic Surgery

## 2022-11-13 ENCOUNTER — Telehealth: Payer: Self-pay

## 2022-11-13 NOTE — Telephone Encounter (Addendum)
Called patient to check to see how she was doing. Patient stated that her PICC line is clogged and the nurse is having to stick her twice a week to do lab draws and that is too much. Informed patient that Dr.Manandhar stated it was necessary to have labs drawn twice a week due to the ab.   Patient also stated that Vancomycin is making her feel lethargic and forgetful and wanted to know if there was another antibiotic she could take.   Patient was told by home health that insurance wont cover cathflow and she wasn't going back to the ED. Please advise.    Dawn Chen Lesli Albee, CMA

## 2022-11-16 ENCOUNTER — Telehealth: Payer: Self-pay

## 2022-11-16 NOTE — Telephone Encounter (Signed)
Notified by Jeri Modena, RN with [facility] that patient's PICC line is not returning blood and patient is having to receive two peripheral sticks a week for lab draws. Home health unable to do Cathflo in the home. Okay to do Cathflo at infusion center per Dr. Elinor Parkinson.   [facility] infusion center happy to see patient this afternoon, but [place] is unable to drive and does not have anyone to take her to the infusion center. Message sent to [facility] to discuss next steps.   Sandie Ano, RN

## 2022-11-17 NOTE — Telephone Encounter (Signed)
Spoke to [employer] with [facility] who reports that RN talked to patient and her sister has been admitted to hospital-that is her only transportation. RN advised that until she can get transportation to [facility], we will have to continue peripheral sticks for labs twice weekly. Patient has agreed and Pam will update [place] if things change at all.

## 2022-11-28 ENCOUNTER — Telehealth (INDEPENDENT_AMBULATORY_CARE_PROVIDER_SITE_OTHER): Payer: Medicare Other | Admitting: Infectious Diseases

## 2022-11-28 ENCOUNTER — Telehealth: Payer: Self-pay

## 2022-11-28 ENCOUNTER — Other Ambulatory Visit: Payer: Self-pay

## 2022-11-28 ENCOUNTER — Other Ambulatory Visit (HOSPITAL_COMMUNITY): Payer: Self-pay

## 2022-11-28 DIAGNOSIS — T8459XA Infection and inflammatory reaction due to other internal joint prosthesis, initial encounter: Secondary | ICD-10-CM

## 2022-11-28 DIAGNOSIS — Z96611 Presence of right artificial shoulder joint: Secondary | ICD-10-CM

## 2022-11-28 DIAGNOSIS — Z452 Encounter for adjustment and management of vascular access device: Secondary | ICD-10-CM

## 2022-11-28 DIAGNOSIS — Z79899 Other long term (current) drug therapy: Secondary | ICD-10-CM

## 2022-11-28 DIAGNOSIS — T8459XD Infection and inflammatory reaction due to other internal joint prosthesis, subsequent encounter: Secondary | ICD-10-CM

## 2022-11-28 MED ORDER — LINEZOLID 600 MG PO TABS: 600.0000 mg | ORAL_TABLET | Freq: Two times a day (BID) | ORAL | 0 refills | Status: DC

## 2022-11-28 NOTE — Telephone Encounter (Signed)
Per Dr. Elinor Parkinson stop Iv abx and pull picc after last dose on 8/21. Sent a message to Jeri Modena, RN at [place] about orders.

## 2022-11-28 NOTE — Addendum Note (Signed)
Addended by: Odette Fraction on: 11/28/2022 07:24 PM   Modules accepted: Orders

## 2022-11-28 NOTE — Progress Notes (Signed)
Virtual Visit via Video Note  I connected withNAME@ on 11/28/22 at  3:30 PM EDT by a video enabled telemedicine application and verified that I am speaking with the correct person using two identifiers.  Location: Patient: Home  Provider: [facility]   I discussed the limitations of evaluation and management by telemedicine and the availability of in person appointments. The patient expressed understanding and agreed to proceed.  [facility]  Patient Active Problem List   Diagnosis Date Noted   Prosthetic shoulder infection, subsequent encounter 10/31/2022   Medication management 10/31/2022   PICC (peripherally inserted central catheter) in place 10/31/2022   Status post reverse total arthroplasty of right shoulder 10/04/2022   Glenohumeral arthritis 07/30/2014   Chronic lumbar pain 07/09/2012   Lumbar degenerative disc disease 07/09/2012    Patient's Medications  New Prescriptions   LINEZOLID (ZYVOX) 600 MG TABLET    Take 1 tablet (600 mg total) by mouth 2 (two) times daily.  Previous Medications   AMITIZA 24 MCG CAPSULE    Take 24 mcg by mouth 2 (two) times daily.   ASPIRIN (ASPIRIN CHILDRENS) 81 MG CHEWABLE TABLET    Chew 1 tablet (81 mg total) by mouth 2 (two) times daily. For 6 weeks for DVT prophylaxis after surgery   COLLAGEN PO    Take 30 mLs by mouth daily.   CONJUGATED ESTROGENS (PREMARIN) VAGINAL CREAM    Place 1 applicator vaginally daily as needed (dryness/irritation).   CYCLOBENZAPRINE (FLEXERIL) 10 MG TABLET    Take 10 mg by mouth 2 (two) times daily.   DIPHENHYDRAMINE-ACETAMINOPHEN (TYLENOL PM) 25-500 MG TABS TABLET    Take 2 tablets by mouth at bedtime.   METHOCARBAMOL (ROBAXIN-750) 750 MG TABLET    Take 1 tablet (750 mg total) by mouth every 8 (eight) hours as needed for muscle spasms.   NAPROXEN (NAPROSYN) 500 MG TABLET    Take 1 tablet (500 mg total) by mouth 2 (two) times daily as needed for moderate pain.   ONDANSETRON (ZOFRAN) 4 MG TABLET     Take 1 tablet (4 mg total) by mouth every 8 (eight) hours as needed for nausea or vomiting.   OXYCODONE (ROXICODONE) 30 MG IMMEDIATE RELEASE TABLET    Take 30 mg by mouth every 4 (four) hours as needed for pain.   TERBINAFINE (LAMISIL) 250 MG TABLET    Take 1 tablet (250 mg total) by mouth daily.   TRAZODONE (DESYREL) 100 MG TABLET    Take 100 mg by mouth at bedtime as needed for sleep.   TRIAMCINOLONE CREAM (KENALOG) 0.1 %    Apply 1 Application topically daily as needed (rash).   VANCOMYCIN (VANCOCIN) 10 G SOLR INJECTION    Inject into the vein every 12 (twelve) hours. 1 dose injected every 12 hours   VITAMIN D, ERGOCALCIFEROL, (DRISDOL) 1.25 MG (50000 UNIT) CAPS CAPSULE    Take 50,000 Units by mouth every 7 (seven) days.  Modified Medications   No medications on file  Discontinued Medications   COLLAGEN PO    Take 3 capsules by mouth daily.    History of Present Illness: 62 year old female with prior history of Lumbar fusion, RT TKA, Gastric bypass, complicated surgical history in her right shoulder with initial right shoulder arthroscopy approximately 10 years ago followed by right anatomic total shoulder replacement on July 30, 2014 with initial noncompliance, and concern for subcapsularis failure in her first postop visit, status post right shoulder subscapularis repair on Aug 18, 2014, lost to  follow-up after August 2016, revision anatomic total shoulder arthroplasty with Dr. Griffith Citron with [facility] orthopedics in May 2017 who is here for hospital follow-up for right shoulder PJI. 6/25 status post removal of right total shoulder arthroplasty and synovectomy, right hemiarthroplasty with antibiotic impregnated implant. Purulence noted in OR as well as cavitary lesion in the glenoid that was bone grafted. Multiple OR cx sent 1 of which was positive for Corynebacterium JK.  Patient was initially discharged on IV daptomycin and ceftriaxone pending cultures on 6/28 however, antibiotics were  switched to vancomycin after or cultures resulted as Corynebacterium JK.  Patient underwent rt total shoulder arthroplasty  revision on 7/24.  Patient also informed [facility] nurse that she wanted PICC line out before her scheduled surgery.  I personally spoke with Dr. Everardo Pacific who told me that patient had to be taken back to surgery because of fracture of the antibiotic spacer.  He actually recommends completion of recommended course of prolonged IV antibiotics for prosthetic joint infection and thinks is an infected joint.   8/20 Patient getting IV Vancomycin. Reports she felt poorly on abtx like feeling fatigued, headache etc. She feels glad her end date of IV abtx is tomorrow. Saw Orthopedics today and sutures removed. Discussed about PO abtx  after completing IV course. She is taking trazodone for anxiety and OK to hold while taking linezolid. She has no concerns with PICC.   Review of Systems: Denies fevers, chills. Denies nausea, vomitin and diarrhea.   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   Past Surgical History:  Procedure Laterality Date   ABDOMINAL HYSTERECTOMY     CARPAL TUNNEL RELEASE Bilateral 2001   CHOLECYSTECTOMY     GASTRIC BYPASS  2013   GYNECOLOGIC CRYOSURGERY     HYSTEROTOMY  2007   JOINT REPLACEMENT Right 03/2013   total knee   LUMBAR FUSION  2014   SHOULDER ACROMIOPLASTY Right 2011   SHOULDER ARTHROSCOPY WITH ROTATOR CUFF REPAIR Right 08/18/2014   Procedure: RIGHT SHOULDER  SUBSCAPULARIS REPAIR;  Surgeon: Jones Broom, MD;  Location: [facility] OR;  Service: Orthopedics;  Laterality: Right;  Right shoulder subscapularis repair   SYNOVECTOMY Right 10/04/2022   Procedure: SYNOVECTOMY;  Surgeon:  Bjorn Pippin, MD;  Location: [facility];  Service: Orthopedics;  Laterality: Right;   TOTAL SHOULDER ARTHROPLASTY Right 07/30/2014   Procedure: TOTAL SHOULDER ARTHROPLASTY;  Surgeon: Jones Broom, MD;  Location: [facility] OR;  Service: Orthopedics;  Laterality: Right;  Right shoulder arthroplasty   TOTAL SHOULDER REVISION Right 10/04/2022   Procedure: RIGHT HEMIARTHROPLASTY WITH ANTIBIOTIC SPACER;  Surgeon: Bjorn Pippin, MD;  Location: [facility];  Service: Orthopedics;  Laterality: Right;   TOTAL SHOULDER REVISION Right 11/01/2022   Procedure: TOTAL SHOULDER REVISION;  Surgeon: Bjorn Pippin, MD;  Location: [facility];  Service: Orthopedics;  Laterality: Right;   Social History   Tobacco Use   Smoking status: Former    Current packs/day: 0.00    Types: Cigarettes    Quit date: 09/08/2017    Years since quitting: 5.2   Smokeless tobacco: Never   Tobacco comments:    Smoking varies  Vaping  Use   Vaping status: Never Used  Substance Use Topics   Alcohol use: No   Drug use: No    Family History  Problem Relation Age of Onset   Breast cancer Maternal Aunt    Cancer Maternal Aunt    Breast cancer Maternal Grandmother    Cancer Maternal Grandmother     Allergies  Allergen Reactions   Diflucan [Fluconazole] Other (See Comments)    Headache    Gabapentin     Pass out    Hydrochlorothiazide Other (See Comments)    Dizzy   Methadone Nausea And Vomiting   Pregabalin Other (See Comments)    Unable to function    Health Maintenance  Topic Date Due   COVID-19 Vaccine (1) Never done   HIV Screening  Never done   Hepatitis C Screening  Never done   DTaP/Tdap/Td (1 - Tdap) Never done   Zoster Vaccines- Shingrix (1 of 2) Never done   PAP SMEAR-Modifier  Never done   Colonoscopy  Never done   INFLUENZA VACCINE  11/09/2022   Medicare Annual Wellness (AWV)  05/13/2023   MAMMOGRAM  07/06/2024   HPV VACCINES  Aged Out    Observations/Objective: Able to talk in full sentences without any  difficulty   Assessment and Plan: 62 year old female with prior history of Lumbar fusion, RT TKA, Gastric bypass, complicated surgical history in her right shoulder as above with    # RT shoulder PJI - In the setting of prior complicated surgical h/o as below  - 6/25 status post removal of right total shoulder arthroplasty and synovectomy, right hemiarthroplasty with antibiotic impregnated implant. Purulence noted in OR as well as cavitary lesion in the glenoid that was bone grafted. Multiple OR cx sent, 1 of which + for Corynebacterium JK - Patient was discharged on IV daptomycin and ceftriaxone 6/28 when abtx was eventually changed to Iv Vancomycin 7/9 after OR cx grew Corynebacterium JK.  - 7/24 she had to be taken for revision of rt reverse shoulder arthroplasty earlier than expected  after IV course due to failed hardware/fracture after surgery   Plan  Complete 4 weeks of IV Vancomycin, EOT 8/21. Patient unwilling to complete full 6 weeks IV.  Plan to start linezolid for 2 weeks starting 8/22 to complete 6 weeks course. She will be stopping trazodone while on linezolid but will take flexeril as needed. Low risk for serotonin syndrome. Discussed with Dr Everardo Pacific today, no more surgical intervention planned as re-implantation was done on 7/24 and hence, no PO suppression needed Fu in 2 weeks   # PICC  To be removed after last dose on 8/21  # Medication Monitoring  8/7 CBC and BMP unremarkable, ESR 21, CRP 1   Follow Up Instructions: 2 weeks    I discussed the assessment and treatment plan with the patient. The patient was provided an opportunity to ask questions and all were answered. The patient agreed with the plan and demonstrated an understanding of the instructions.   The patient was advised to call back or seek an in-person evaluation if the symptoms worsen or if the condition fails to improve as anticipated.  I provided 41 minutes of non-face-to-face time during this  encounter.  Victoriano Lain, MD [facility] [phone] pager   [phone] cell 11/28/2022, 6:05 PM

## 2022-11-28 NOTE — Telephone Encounter (Signed)
[facility] Patient Advocate Encounter  Prior Authorization for Dawn Chen has been approved.    PA# [id number] Effective dates: 11/28/22 through 04/10/23  Patients co-pay is $11/20.   Prescription can be filled at [facility].  [facility] will continue to follow.  [name], CPhT Specialty Pharmacy Patient [name] Phone: [phone] Fax:  [phone]

## 2022-11-28 NOTE — Telephone Encounter (Signed)
[facility] Patient Advocate Encounter   Received notification from [employer] [facility] that prior authorization for Dawn Chen is required.   PA submitted on 11/28/22 Key [id number] Status is pending    [facility] will continue to follow.   [name], CPhT Specialty Pharmacy Patient [facility] for [facility]ious Disease Phone: [phone] Fax:  [phone]

## 2022-11-29 ENCOUNTER — Telehealth: Payer: Self-pay

## 2022-11-29 NOTE — Telephone Encounter (Signed)
Patient sent message stating oral antibiotic was on back order. Called [facility] and was told that medication had to be ordered. Should be delivered to pharmacy today. Advised patient to call pharmacy later today.  Dawn Chen, RMA

## 2022-11-30 ENCOUNTER — Other Ambulatory Visit: Payer: Self-pay

## 2022-11-30 ENCOUNTER — Other Ambulatory Visit (HOSPITAL_COMMUNITY): Payer: Self-pay

## 2022-11-30 MED ORDER — LINEZOLID 600 MG PO TABS
600.0000 mg | ORAL_TABLET | Freq: Two times a day (BID) | ORAL | 0 refills | Status: DC
  Filled 2022-11-30: qty 28, 14d supply, fill #0

## 2022-11-30 NOTE — Telephone Encounter (Signed)
Sent Linezolid to [facility] - patient will not be able to get medication until Monday. Advised I would send message back to provider to make her aware.    Safia Panzer Lesli Albee, CMA

## 2022-12-13 ENCOUNTER — Ambulatory Visit: Payer: Medicare Other | Admitting: Internal Medicine

## 2022-12-19 ENCOUNTER — Other Ambulatory Visit (HOSPITAL_COMMUNITY): Payer: Self-pay

## 2022-12-19 ENCOUNTER — Other Ambulatory Visit: Payer: Self-pay

## 2022-12-19 ENCOUNTER — Telehealth: Payer: Self-pay

## 2022-12-19 MED ORDER — LINEZOLID 600 MG PO TABS
600.0000 mg | ORAL_TABLET | Freq: Two times a day (BID) | ORAL | 0 refills | Status: AC
  Filled 2022-12-19: qty 28, 14d supply, fill #0

## 2022-12-19 NOTE — Telephone Encounter (Signed)
Spoke with patient and informed her that refill has been called into pharmacy. Provider her with number to follow up.  Patient is not able to come in before follow up visit for labs due to transportation. Would like this done at her upcoming appt. Dawn Chen, RMA

## 2022-12-19 NOTE — Telephone Encounter (Signed)
Patient called office stating she will be taking her last dose of Linezolid today. Is not scheduled for follow up until the end of this September. Would like to know if she needs to continue with this or if she done.  Will forward message to provider. Juanita Laster, RMA

## 2022-12-19 NOTE — Addendum Note (Signed)
Addended by: Juanita Laster on: 12/19/2022 10:00 AM   Modules accepted: Orders

## 2022-12-20 NOTE — Telephone Encounter (Signed)
Reasonable to stop antibiotics as cultures were taken before most recent surgery and would suspect source control achieved.

## 2022-12-20 NOTE — Telephone Encounter (Signed)
Could her appointment with Dr. Thedore Mins be pushed up some? I know the reason she has an additional 2-week extension was to bridge her with more linezolid until she followed up with Dr. Thedore Mins. Technically, she did complete her full 6 weeks of treatment already. Would defer to Dr. Thedore Mins. Marchelle Folks

## 2022-12-20 NOTE — Telephone Encounter (Signed)
Patient called stating that she is having bad headaches since taking Linezolid to the point where she has to lay down after taking abx. Tylenol doesn't help with pain. Patient wanted to see if she needed to be switched to another abx? Also wants to know if she can take Excedrin.   Micaella Gitto Lesli Albee, CMA

## 2022-12-21 NOTE — Telephone Encounter (Signed)
Patient aware and voiced her understanding.   Tiffany P Speight, CMA  

## 2023-01-02 ENCOUNTER — Ambulatory Visit: Payer: Medicare Other | Admitting: Internal Medicine

## 2023-01-02 ENCOUNTER — Encounter: Payer: Self-pay | Admitting: Internal Medicine

## 2023-01-02 ENCOUNTER — Other Ambulatory Visit: Payer: Self-pay

## 2023-01-02 VITALS — BP 114/80 | HR 112 | Temp 98.0°F | Ht 68.0 in | Wt 155.0 lb

## 2023-01-02 DIAGNOSIS — Z96619 Presence of unspecified artificial shoulder joint: Secondary | ICD-10-CM

## 2023-01-02 DIAGNOSIS — T8459XD Infection and inflammatory reaction due to other internal joint prosthesis, subsequent encounter: Secondary | ICD-10-CM | POA: Diagnosis not present

## 2023-01-02 NOTE — Progress Notes (Signed)
Patient: Dawn Chen  DOB: March 29, 1961 MRN: [id number] PCP: Jackelyn Poling, DO    Chief Complaint  Patient presents with   Follow-up     Patient Active Problem List   Diagnosis Date Noted   Prosthetic shoulder infection, subsequent encounter 10/31/2022   Medication management 10/31/2022   PICC (peripherally inserted central catheter) in place 10/31/2022   Status post reverse total arthroplasty of right shoulder 10/04/2022   Glenohumeral arthritis 07/30/2014   Chronic lumbar pain 07/09/2012   Lumbar degenerative disc disease 07/09/2012     Subjective:  Dawn Chen is a 62 y.o. F with PMHx of R shoulder PJI. She stopped linezolid on 9/11. PICC line out. States both of her shoulder are achy due to the rain today. No  fever or chills. Followed by Dr. Elinor Parkinson in the past, last seen on 8/20 Please see HPI from 8/20 for further details: "62 year old female with prior history of Lumbar fusion, RT TKA, Gastric bypass, complicated surgical history in her right shoulder with initial right shoulder arthroscopy approximately 10 years ago followed by right anatomic total shoulder replacement on July 30, 2014 with initial noncompliance, and concern for subcapsularis failure in her first postop visit, status post right shoulder subscapularis repair on Aug 18, 2014, lost to follow-up after August 2016, revision anatomic total shoulder arthroplasty with Dr. Griffith Citron with [facility] orthopedics in May 2017 who is here for hospital follow-up for right shoulder PJI. 6/25 status post removal of right total shoulder arthroplasty and synovectomy, right hemiarthroplasty with antibiotic impregnated implant. Purulence noted in OR as well as cavitary lesion in the glenoid that was bone grafted. Multiple OR cx sent 1 of which was positive for Corynebacterium JK.  Patient was initially discharged on IV daptomycin and ceftriaxone pending cultures on 6/28 however, antibiotics were switched to vancomycin after  or cultures resulted as Corynebacterium JK.  Patient underwent rt total shoulder arthroplasty  revision on 7/24.  Patient also informed [facility] nurse that she wanted PICC line out before her scheduled surgery.  I personally spoke with Dr. Everardo Pacific who told me that patient had to be taken back to surgery because of fracture of the antibiotic spacer.  He actually recommends completion of recommended course of prolonged IV antibiotics for prosthetic joint infection and thinks is an infected joint.    8/20 Patient getting IV Vancomycin. Reports she felt poorly on abtx like feeling fatigued, headache etc. She feels glad her end date of IV abtx is tomorrow. Saw Orthopedics today and sutures removed. Discussed about PO abtx  after completing IV course. She is taking trazodone for anxiety and OK to hold while taking linezolid. She has no concerns with PICC. "  Review of Systems  All other systems reviewed and are negative.   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    Outpatient Medications Prior to Visit  Medication Sig Dispense Refill   AMITIZA 24 MCG capsule Take 24 mcg by mouth 2 (two) times daily.     COLLAGEN PO Take 30 mLs by mouth daily.     conjugated estrogens (PREMARIN) vaginal cream Place 1 applicator vaginally  daily as needed (dryness/irritation).     cyclobenzaprine (FLEXERIL) 10 MG tablet Take 10 mg by mouth 2 (two) times daily.     diphenhydramine-acetaminophen (TYLENOL PM) 25-500 MG TABS tablet Take 2 tablets by mouth at bedtime.     Ferrous Sulfate (IRON PO) Take by mouth daily.     methocarbamol (ROBAXIN-750) 750 MG tablet Take 1 tablet (750 mg total) by mouth every 8 (eight) hours as needed for muscle  spasms. 30 tablet 0   ondansetron (ZOFRAN) 4 MG tablet Take 1 tablet (4 mg total) by mouth every 8 (eight) hours as needed for nausea or vomiting. 20 tablet 0   oxycodone (ROXICODONE) 30 MG immediate release tablet Take 30 mg by mouth every 4 (four) hours as needed for pain.  0   terbinafine (LAMISIL) 250 MG tablet Take 1 tablet (250 mg total) by mouth daily. 90 tablet 0   traZODone (DESYREL) 100 MG tablet Take 100 mg by mouth at bedtime as needed for sleep.     triamcinolone cream (KENALOG) 0.1 % Apply 1 Application topically daily as needed (rash).     Vitamin D, Ergocalciferol, (DRISDOL) 1.25 MG (50000 UNIT) CAPS capsule Take 50,000 Units by mouth every 7 (seven) days.     linezolid (ZYVOX) 600 MG tablet Take 1 tablet (600 mg total) by mouth 2 (two) times daily for 14 days. (Patient not taking: Reported on 01/02/2023) 28 tablet 0   vancomycin (VANCOCIN) 10 G SOLR injection Inject into the vein every 12 (twelve) hours. 1 dose injected every 12 hours (Patient not taking: Reported on 01/02/2023)     No facility-administered medications prior to visit.     Allergies  Allergen Reactions   Diflucan [Fluconazole] Other (See Comments)    Headache    Gabapentin     Pass out    Hydrochlorothiazide Other (See Comments)    Dizzy   Methadone Nausea And Vomiting   Pregabalin Other (See Comments)    Unable to function    Social History   Tobacco Use   Smoking status: Former    Current packs/day: 0.00    Types: Cigarettes    Quit date: 09/08/2017    Years since quitting: 5.3   Smokeless tobacco: Never   Tobacco comments:    Smoking varies  Vaping Use   Vaping status: Never Used  Substance Use Topics   Alcohol use: No   Drug use: No    Family History  Problem Relation Age of Onset   Breast cancer Maternal Aunt    Cancer Maternal Aunt    Breast cancer Maternal Grandmother    Cancer Maternal Grandmother     Objective:   Vitals:   01/02/23 1046  BP: 114/80  Temp: 98 F (36.7 C)   TempSrc: Temporal  Weight: 155 lb (70.3 kg)  Height: 5\' 8"  (1.727 m)   Body mass index is 23.57 kg/m.  Physical Exam Constitutional:      Appearance: Normal appearance.  HENT:     Head: Normocephalic and atraumatic.     Right Ear: Tympanic membrane normal.     Left Ear: Tympanic membrane normal.     Nose: Nose normal.     Mouth/Throat:     Mouth: Mucous membranes are moist.  Eyes:     Extraocular Movements: Extraocular movements intact.     Conjunctiva/sclera: Conjunctivae normal.     Pupils: Pupils are equal, round, and reactive to light.  Cardiovascular:     Rate and Rhythm: Normal rate and  regular rhythm.     Heart sounds: No murmur heard.    No friction rub. No gallop.  Pulmonary:     Effort: Pulmonary effort is normal.     Breath sounds: Normal breath sounds.  Abdominal:     General: Abdomen is flat.     Palpations: Abdomen is soft.  Skin:    General: Skin is warm and dry.  Neurological:     General: No focal deficit present.     Mental Status: She is alert and oriented to person, place, and time.  Psychiatric:        Mood and Affect: Mood normal.     Lab Results: Lab Results  Component Value Date   WBC 6.3 10/25/2022   HGB 10.4 (L) 10/25/2022   HCT 32.8 (L) 10/25/2022   MCV 99.1 10/25/2022   PLT 436 (H) 10/25/2022    Lab Results  Component Value Date   CREATININE 0.64 10/25/2022   BUN 17 10/25/2022   NA 139 10/25/2022   K 4.0 10/25/2022   CL 106 10/25/2022   CO2 25 10/25/2022    Lab Results  Component Value Date   ALT 22 10/25/2022   AST 23 10/25/2022   ALKPHOS 79 10/25/2022   BILITOT 0.3 10/25/2022     Assessment & Plan:  62 year old female with prior history of Lumbar fusion, RT TKA, Gastric bypass, complicated surgical history in her right shoulder as above with    # RT shoulder PJI SP removal of right total shoulder arthroplasty + spacer placement on 6/24 Cx+ corynebacterium JK discharge on dapto+ ctx EOT 8/6 c/b 7/24 she had to be  taken for revision of rt reverse shoulder arthroplasty(no Cx) - On 6/25 pt underwent  removal of right total shoulder arthroplasty and synovectomy, right hemiarthroplasty with antibiotic impregnated implant. Purulence noted in OR as well as cavitary lesion in the glenoid that was bone grafted. Multiple OR cx sent, 1 of which + for Corynebacterium JK - Discharged on IV daptomycin and ceftriaxone 6/28 when abtx was eventually changed to Iv Vancomycin 7/9 after OR cx grew Corynebacterium JK.  - On 7/24 underwent  revision of rt reverse shoulder arthroplasty earlier than expected  after IV course due to failed hardware/fracture after surgery  -Completed 4 weeks of IV Vancomycin, EOT 8/21(unwilling to do 6 weeks IV)+ 2 weeks linezolid eot around 9/11  -Patient was last seen by Dr. Everardo Pacific in August.  Counseled to follow-up with orthopedics. - Last dose of antibiotics was around 9/11.  Patient seems to be doing well off of antibiotics.  Wound appears to be healing(no signs of infection on exam), she does have some pain in the right shoulder. Plan: - Labs today off of antibiotics - Follow-up with Dr. Elinor Parkinson on 10/2 to review labs   Danelle Earthly, MD [facility]   01/02/23  10:50 AM  I have personally spent 45 minutes involved in face-to-face and non-face-to-face activities for this patient on the day of the visit. Professional time spent includes the following activities: Preparing to see the patient (review of tests), Obtaining and/or reviewing separately obtained history (admission/discharge record), Performing a medically appropriate examination and/or evaluation , Ordering medications/tests/procedures, referring and communicating with other health care professionals, Documenting clinical information in the EMR, Independently interpreting results (not separately reported), Communicating results to the patient/family/caregiver, Counseling and  educating the patient/family/caregiver and Care coordination (not separately reported).

## 2023-01-03 ENCOUNTER — Encounter: Payer: Self-pay | Admitting: Infectious Diseases

## 2023-01-03 LAB — CBC WITH DIFFERENTIAL/PLATELET
Absolute Monocytes: 463 cells/uL (ref 200–950)
Basophils Absolute: 83 cells/uL (ref 0–200)
Basophils Relative: 1.6 %
Eosinophils Absolute: 140 cells/uL (ref 15–500)
Eosinophils Relative: 2.7 %
HCT: 23.6 % — ABNORMAL LOW (ref 35.0–45.0)
Hemoglobin: 7.2 g/dL — ABNORMAL LOW (ref 11.7–15.5)
Lymphs Abs: 2106 cells/uL (ref 850–3900)
MCH: 28.9 pg (ref 27.0–33.0)
MCHC: 30.5 g/dL — ABNORMAL LOW (ref 32.0–36.0)
MCV: 94.8 fL (ref 80.0–100.0)
MPV: 9.7 fL (ref 7.5–12.5)
Monocytes Relative: 8.9 %
Neutro Abs: 2408 cells/uL (ref 1500–7800)
Neutrophils Relative %: 46.3 %
Platelets: 630 10*3/uL — ABNORMAL HIGH (ref 140–400)
RBC: 2.49 10*6/uL — ABNORMAL LOW (ref 3.80–5.10)
RDW: 13.4 % (ref 11.0–15.0)
Total Lymphocyte: 40.5 %
WBC: 5.2 10*3/uL (ref 3.8–10.8)

## 2023-01-03 LAB — COMPLETE METABOLIC PANEL WITH GFR
AG Ratio: 1.7 (calc) (ref 1.0–2.5)
ALT: 22 U/L (ref 6–29)
AST: 20 U/L (ref 10–35)
Albumin: 4 g/dL (ref 3.6–5.1)
Alkaline phosphatase (APISO): 88 U/L (ref 37–153)
BUN: 17 mg/dL (ref 7–25)
CO2: 27 mmol/L (ref 20–32)
Calcium: 9.4 mg/dL (ref 8.6–10.4)
Chloride: 105 mmol/L (ref 98–110)
Creat: 0.84 mg/dL (ref 0.50–1.05)
Globulin: 2.4 g/dL (calc) (ref 1.9–3.7)
Glucose, Bld: 146 mg/dL — ABNORMAL HIGH (ref 65–99)
Potassium: 4.2 mmol/L (ref 3.5–5.3)
Sodium: 138 mmol/L (ref 135–146)
Total Bilirubin: 0.2 mg/dL (ref 0.2–1.2)
Total Protein: 6.4 g/dL (ref 6.1–8.1)
eGFR: 79 mL/min/{1.73_m2} (ref >=60)

## 2023-01-03 LAB — C-REACTIVE PROTEIN: CRP: <3 mg/L (ref <8.0)

## 2023-01-03 LAB — SEDIMENTATION RATE: Sed Rate: 11 mm/h (ref 0–30)

## 2023-01-04 ENCOUNTER — Other Ambulatory Visit: Payer: Self-pay

## 2023-01-04 ENCOUNTER — Other Ambulatory Visit: Payer: Medicare Other

## 2023-01-04 DIAGNOSIS — T8459XD Infection and inflammatory reaction due to other internal joint prosthesis, subsequent encounter: Secondary | ICD-10-CM

## 2023-01-04 LAB — CBC WITH DIFFERENTIAL/PLATELET
Abs Immature Granulocytes: 0 10*3/uL (ref 0.00–0.07)
Basophils Absolute: 0.1 10*3/uL (ref 0.0–0.1)
Basophils Relative: 2 %
Eosinophils Absolute: 0.1 10*3/uL (ref 0.0–0.5)
Eosinophils Relative: 2 %
HCT: 25.4 % — ABNORMAL LOW (ref 36.0–46.0)
Hemoglobin: 7.8 g/dL — ABNORMAL LOW (ref 12.0–15.0)
Immature Granulocytes: 0 %
Lymphocytes Relative: 36 %
Lymphs Abs: 1.9 10*3/uL (ref 0.7–4.0)
MCH: 29.2 pg (ref 26.0–34.0)
MCHC: 30.7 g/dL (ref 30.0–36.0)
MCV: 95.1 fL (ref 80.0–100.0)
Monocytes Absolute: 0.5 10*3/uL (ref 0.1–1.0)
Monocytes Relative: 9 %
Neutro Abs: 2.7 10*3/uL (ref 1.7–7.7)
Neutrophils Relative %: 51 %
Platelets: 607 10*3/uL — ABNORMAL HIGH (ref 150–400)
RBC: 2.67 MIL/uL — ABNORMAL LOW (ref 3.87–5.11)
RDW: 15.4 % (ref 11.5–15.5)
WBC: 5.3 10*3/uL (ref 4.0–10.5)
nRBC: 0 % (ref 0.0–0.2)

## 2023-01-05 ENCOUNTER — Telehealth: Payer: Self-pay

## 2023-01-05 NOTE — Telephone Encounter (Signed)
-----   Message from Victoriano Lain sent at 01/05/2023  7:17 AM EDT ----- Please inform Hb has come up to 7.8. can be monitored for now unless any note of bleeding anywhere.

## 2023-01-05 NOTE — Telephone Encounter (Signed)
Patient aware.

## 2023-01-10 ENCOUNTER — Other Ambulatory Visit: Payer: Self-pay

## 2023-01-10 ENCOUNTER — Telehealth (INDEPENDENT_AMBULATORY_CARE_PROVIDER_SITE_OTHER): Payer: Medicare Other | Admitting: Infectious Diseases

## 2023-01-10 DIAGNOSIS — Z79899 Other long term (current) drug therapy: Secondary | ICD-10-CM

## 2023-01-10 DIAGNOSIS — Z96619 Presence of unspecified artificial shoulder joint: Secondary | ICD-10-CM

## 2023-01-10 DIAGNOSIS — T8459XD Infection and inflammatory reaction due to other internal joint prosthesis, subsequent encounter: Secondary | ICD-10-CM | POA: Diagnosis not present

## 2023-01-10 DIAGNOSIS — D509 Iron deficiency anemia, unspecified: Secondary | ICD-10-CM | POA: Insufficient documentation

## 2023-01-10 NOTE — Progress Notes (Signed)
Virtual Visit via Video Note  I connected withNAME@ on 01/10/23 at  9:30 AM EDT by a video enabled telemedicine application and verified that I am speaking with the correct person using two identifiers.  Location: Patient: Home Provider: [facility]   I discussed the limitations of evaluation and management by telemedicine and the availability of in person appointments. The patient expressed understanding and agreed to proceed.  [facility]  Patient Active Problem List   Diagnosis Date Noted   Prosthetic shoulder infection, subsequent encounter 10/31/2022   Medication management 10/31/2022   PICC (peripherally inserted central catheter) in place 10/31/2022   Status post reverse total arthroplasty of right shoulder 10/04/2022   Glenohumeral arthritis 07/30/2014   Chronic lumbar pain 07/09/2012   Lumbar degenerative disc disease 07/09/2012    Patient's Medications  New Prescriptions   No medications on file  Previous Medications   AMITIZA 24 MCG CAPSULE    Take 24 mcg by mouth 2 (two) times daily.   COLLAGEN PO    Take 30 mLs by mouth daily.   CONJUGATED ESTROGENS (PREMARIN) VAGINAL CREAM    Place 1 applicator vaginally daily as needed (dryness/irritation).   CYCLOBENZAPRINE (FLEXERIL) 10 MG TABLET    Take 10 mg by mouth 2 (two) times daily.   DIPHENHYDRAMINE-ACETAMINOPHEN (TYLENOL PM) 25-500 MG TABS TABLET    Take 2 tablets by mouth at bedtime.   FERROUS SULFATE (IRON PO)    Take by mouth daily.   METHOCARBAMOL (ROBAXIN-750) 750 MG TABLET    Take 1 tablet (750 mg total) by mouth every 8 (eight) hours as needed for muscle spasms.   ONDANSETRON (ZOFRAN) 4 MG TABLET    Take 1 tablet (4 mg total) by mouth every 8 (eight) hours as needed for nausea or vomiting.   OXYCODONE (ROXICODONE) 30 MG IMMEDIATE RELEASE TABLET    Take 30 mg by mouth every 4 (four) hours as needed for pain.   TERBINAFINE (LAMISIL) 250 MG TABLET    Take 1 tablet (250 mg total) by mouth daily.    TRAZODONE (DESYREL) 100 MG TABLET    Take 100 mg by mouth at bedtime as needed for sleep.   TRIAMCINOLONE CREAM (KENALOG) 0.1 %    Apply 1 Application topically daily as needed (rash).   VANCOMYCIN (VANCOCIN) 10 G SOLR INJECTION    Inject into the vein every 12 (twelve) hours. 1 dose injected every 12 hours   VITAMIN D, ERGOCALCIFEROL, (DRISDOL) 1.25 MG (50000 UNIT) CAPS CAPSULE    Take 50,000 Units by mouth every 7 (seven) days.  Modified Medications   No medications on file  Discontinued Medications   No medications on file    History of Present Illness: 62 year old female with prior history of Lumbar fusion, RT TKA, Gastric bypass, complicated surgical history in her right shoulder with initial right shoulder arthroscopy approximately 10 years ago followed by right anatomic total shoulder replacement on July 30, 2014 with initial noncompliance, and concern for subcapsularis failure in her first postop visit, status post right shoulder subscapularis repair on Aug 18, 2014, lost to follow-up after August 2016, revision anatomic total shoulder arthroplasty with Dr. Griffith Citron with [facility] orthopedics in May 2017 who is here for hospital follow-up for right shoulder PJI. 6/25 status post removal of right total shoulder arthroplasty and synovectomy, right hemiarthroplasty with antibiotic impregnated implant. Purulence noted in OR as well as cavitary lesion in the glenoid that was bone grafted. Multiple OR cx sent 1 of which was  positive for Corynebacterium JK.  Patient was initially discharged on IV daptomycin and ceftriaxone pending cultures on 6/28 however, antibiotics were switched to vancomycin after or cultures resulted as Corynebacterium JK.  Patient underwent rt total shoulder arthroplasty  revision on 7/24.  Patient also informed [facility] nurse that she wanted PICC line out before her scheduled surgery.  I personally spoke with Dr. Everardo Pacific who told me that patient had to be taken back to surgery because  of fracture of the antibiotic spacer.  He actually recommends completion of recommended course of prolonged IV antibiotics for prosthetic joint infection and thinks is an infected joint.  Completd 4 weeks of IV Vancomycin, EOT 8/21. Patient unwilling to complete full 6 weeks IV. Switched to PO linezolid on 8/22 to complete 2 weeks course. Last seen by Dr Thedore Mins 9/26, patient off abtx, lab done with normal ESR and CRP  01/10/23 Discussed last labs where hb dropped from 10.4 to 7.2 then repeat 7.8. She reports she has been taking iron pills once daily while being treated for shoulder infection which she was supposed to take three times daily. She has started taking 3 times daily for 3 days and has a fu with PCP coming up. Reports being off abtx, rt shoulder doing well but had some pain which she thinks is related to change in weather. Report seeing Dr Everardo Pacific yesterday. Denies fevers, chills. Denies nausea, vomiting and diarrhea. Denies tenderness, swelling in the rt shoulder.   ROS all systems reviewed with pertinent positives and negatives as listed above  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   Past Surgical History:  Procedure Laterality Date   ABDOMINAL HYSTERECTOMY     CARPAL TUNNEL RELEASE Bilateral 2001   CHOLECYSTECTOMY     GASTRIC BYPASS  2013   GYNECOLOGIC CRYOSURGERY     HYSTEROTOMY  2007   JOINT REPLACEMENT Right 03/2013   total knee   LUMBAR FUSION  2014   SHOULDER ACROMIOPLASTY Right 2011   SHOULDER ARTHROSCOPY WITH ROTATOR CUFF REPAIR Right 08/18/2014   Procedure: RIGHT SHOULDER  SUBSCAPULARIS REPAIR;  Surgeon: Jones Broom, MD;  Location: [facility] OR;  Service: Orthopedics;  Laterality: Right;   Right shoulder subscapularis repair   SYNOVECTOMY Right 10/04/2022   Procedure: SYNOVECTOMY;  Surgeon: Bjorn Pippin, MD;  Location: [facility];  Service: Orthopedics;  Laterality: Right;   TOTAL SHOULDER ARTHROPLASTY Right 07/30/2014   Procedure: TOTAL SHOULDER ARTHROPLASTY;  Surgeon: Jones Broom, MD;  Location: [facility] OR;  Service: Orthopedics;  Laterality: Right;  Right shoulder arthroplasty   TOTAL SHOULDER REVISION Right 10/04/2022   Procedure: RIGHT HEMIARTHROPLASTY WITH ANTIBIOTIC SPACER;  Surgeon: Bjorn Pippin, MD;  Location: [facility];  Service: Orthopedics;  Laterality: Right;   TOTAL SHOULDER REVISION Right 11/01/2022   Procedure: TOTAL SHOULDER REVISION;  Surgeon: Bjorn Pippin, MD;  Location: [facility];  Service: Orthopedics;  Laterality: Right;    Social History   Tobacco Use   Smoking status: Former    Current packs/day: 0.00    Types: Cigarettes    Quit date: 09/08/2017    Years since quitting: 5.3   Smokeless tobacco: Never  Tobacco comments:    Smoking varies  Vaping Use   Vaping status: Never Used  Substance Use Topics   Alcohol use: No   Drug use: No    Family History  Problem Relation Age of Onset   Breast cancer Maternal Aunt    Cancer Maternal Aunt    Breast cancer Maternal Grandmother    Cancer Maternal Grandmother     Allergies  Allergen Reactions   Diflucan [Fluconazole] Other (See Comments)    Headache    Gabapentin     Pass out    Hydrochlorothiazide Other (See Comments)    Dizzy   Methadone Nausea And Vomiting   Pregabalin Other (See Comments)    Unable to function    Health Maintenance  Topic Date Due   COVID-19 Vaccine (1) Never done   HIV Screening  Never done   Hepatitis C Screening  Never done   DTaP/Tdap/Td (1 - Tdap) Never done   Zoster Vaccines- Shingrix (1 of 2) Never done   Cervical Cancer Screening (HPV/Pap Cotest)  Never done   Colonoscopy  Never done   INFLUENZA VACCINE  Never done   Medicare Annual Wellness (AWV)  05/13/2023    MAMMOGRAM  07/06/2024   HPV VACCINES  Aged Out    Observations/Objective: Sitting inside parked car  Assessment and Plan: 62 year old female with prior history of Lumbar fusion, RT TKA, Gastric bypass, complicated surgical history in her right shoulder as above with    # RT shoulder PJI - In the setting of prior complicated surgical h/o as below  - 6/25 status post removal of right total shoulder arthroplasty and synovectomy, right hemiarthroplasty with antibiotic impregnated implant. Purulence noted in OR as well as cavitary lesion in the glenoid that was bone grafted. Multiple OR cx sent, 1 of which + for Corynebacterium JK - Patient was discharged on IV daptomycin and ceftriaxone 6/28 when abtx was eventually changed to Iv Vancomycin 7/9 after OR cx grew Corynebacterium JK.  - 7/24 she had to be taken for revision of rt reverse shoulder arthroplasty earlier than expected  after IV course due to failed hardware/fracture after surgery  - Completed 4 weeks of IV Vancomycin, EOT 8/22 followed by 2 weeks of PO linezolid  # Medication monitoring  - 9/26 hb down from 10.4 to 7.2, has h/o iron def anemia. Started taking iron pills, has a fu with PCP coming up. ESR and CRP wnl   Follow Up Instructions: Fu in a month  Fu with Orthopedics as instructed    I discussed the assessment and treatment plan with the patient. The patient was provided an opportunity to ask questions and all were answered. The patient agreed with the plan and demonstrated an understanding of the instructions.   The patient was advised to call back or seek an in-person evaluation if the symptoms worsen or if the condition fails to improve as anticipated.  I provided 40 minutes of non-face-to-face time during this encounter.  Victoriano Lain, MD [facility] [phone] pager   [phone] cell 01/10/2023, 9:16 AM

## 2023-02-22 ENCOUNTER — Telehealth: Payer: Self-pay

## 2023-02-22 NOTE — Telephone Encounter (Signed)
Patient called stating that the infection in her shoulder is coming back. Patient stated that she noticed bumps and squeezed one and discharge with an odor came out. Patient denies fever, chills or pain. Patient stated that she still has some Linezolid at home and will start taking it again. I advised patient I would send a message back to provider. Patient transferred back to front desk as she is due for follow up. Patient uses [facility] on [address]    Quinto Tippy Lesli Albee, CMA

## 2023-02-23 NOTE — Telephone Encounter (Signed)
Patient aware and voiced her understanding.   Anju Sereno P Naiya Corral, CMA  

## 2023-03-07 ENCOUNTER — Ambulatory Visit: Payer: Medicare Other | Admitting: Infectious Diseases

## 2023-04-25 ENCOUNTER — Ambulatory Visit: Payer: Medicare Other | Admitting: Podiatry

## 2023-04-26 ENCOUNTER — Telehealth: Payer: Self-pay

## 2023-04-26 NOTE — Telephone Encounter (Signed)
Patient called, states she has had memory problems since her antibiotic therapy and was told by her nurse that this is a known side effect of vancomycin.   She is requesting a letter from Dr. Elinor Parkinson listing common side effects of vancomycin, daptomycin, and ceftriaxone since she has received all three of these.   Would like the letter emailed to: [email or web address]    Sandie Ano, RN

## 2023-04-29 ENCOUNTER — Encounter: Payer: Self-pay | Admitting: Infectious Diseases

## 2023-04-30 ENCOUNTER — Encounter: Payer: Self-pay | Admitting: Infectious Diseases

## 2023-05-02 ENCOUNTER — Telehealth: Payer: Medicare Other | Admitting: Infectious Diseases

## 2023-05-03 ENCOUNTER — Telehealth: Payer: Self-pay | Admitting: Infectious Diseases

## 2023-05-03 NOTE — Telephone Encounter (Signed)
Dawn Chen left a voicemail responding to a reschedule request. Patient stated she does not wish to reschedule her appointment, she is ok, and has everything she needs from Dr. Elinor Parkinson. If needed, she can be reached at [phone].

## 2023-06-18 ENCOUNTER — Other Ambulatory Visit (HOSPITAL_COMMUNITY): Payer: Self-pay

## 2023-06-18 ENCOUNTER — Other Ambulatory Visit: Payer: Self-pay

## 2023-06-18 MED ORDER — OXYCODONE HCL 30 MG PO TABS
30.0000 mg | ORAL_TABLET | Freq: Four times a day (QID) | ORAL | 0 refills | Status: DC | PRN
  Filled 2023-06-18 (×2): qty 120, 30d supply, fill #0

## 2023-06-18 MED ORDER — CYCLOBENZAPRINE HCL 10 MG PO TABS
10.0000 mg | ORAL_TABLET | Freq: Two times a day (BID) | ORAL | 0 refills | Status: DC | PRN
  Filled 2023-06-18 (×2): qty 60, 30d supply, fill #0

## 2023-07-17 ENCOUNTER — Other Ambulatory Visit (HOSPITAL_COMMUNITY): Payer: Self-pay

## 2023-07-17 ENCOUNTER — Other Ambulatory Visit: Payer: Self-pay

## 2023-07-17 MED ORDER — CYCLOBENZAPRINE HCL 10 MG PO TABS
10.0000 mg | ORAL_TABLET | Freq: Two times a day (BID) | ORAL | 0 refills | Status: DC | PRN
  Filled 2023-07-17 (×3): qty 60, 30d supply, fill #0

## 2023-07-17 MED ORDER — OXYCODONE HCL 30 MG PO TABS
30.0000 mg | ORAL_TABLET | Freq: Four times a day (QID) | ORAL | 0 refills | Status: DC | PRN
  Filled 2023-07-17 (×3): qty 120, 30d supply, fill #0

## 2023-08-16 ENCOUNTER — Other Ambulatory Visit (HOSPITAL_COMMUNITY): Payer: Self-pay

## 2023-08-16 ENCOUNTER — Other Ambulatory Visit: Payer: Self-pay

## 2023-08-16 MED ORDER — CYCLOBENZAPRINE HCL 10 MG PO TABS
10.0000 mg | ORAL_TABLET | Freq: Two times a day (BID) | ORAL | 0 refills | Status: DC | PRN
  Filled 2023-08-16 (×2): qty 60, 30d supply, fill #0

## 2023-08-16 MED ORDER — OXYCODONE HCL 30 MG PO TABS
30.0000 mg | ORAL_TABLET | Freq: Four times a day (QID) | ORAL | 0 refills | Status: AC | PRN
  Filled 2023-08-16 (×2): qty 120, 30d supply, fill #0

## 2023-09-12 ENCOUNTER — Other Ambulatory Visit (HOSPITAL_COMMUNITY): Payer: Self-pay

## 2023-09-12 MED ORDER — CYCLOBENZAPRINE HCL 10 MG PO TABS
10.0000 mg | ORAL_TABLET | Freq: Two times a day (BID) | ORAL | 0 refills | Status: AC | PRN
  Filled 2023-09-12: qty 60, 30d supply, fill #0

## 2023-09-12 MED ORDER — OXYCODONE HCL 30 MG PO TABS
30.0000 mg | ORAL_TABLET | Freq: Four times a day (QID) | ORAL | 0 refills | Status: DC | PRN
  Filled 2023-09-13: qty 120, 30d supply, fill #0

## 2023-09-13 ENCOUNTER — Other Ambulatory Visit (HOSPITAL_COMMUNITY): Payer: Self-pay

## 2023-09-24 ENCOUNTER — Ambulatory Visit (HOSPITAL_BASED_OUTPATIENT_CLINIC_OR_DEPARTMENT_OTHER): Admitting: Internal Medicine

## 2023-10-11 ENCOUNTER — Other Ambulatory Visit (HOSPITAL_COMMUNITY): Payer: Self-pay

## 2023-10-11 MED ORDER — CYCLOBENZAPRINE HCL 10 MG PO TABS
10.0000 mg | ORAL_TABLET | Freq: Two times a day (BID) | ORAL | 0 refills | Status: DC
  Filled 2023-10-11: qty 60, 30d supply, fill #0

## 2023-10-11 MED ORDER — OXYCODONE HCL 30 MG PO TABS
30.0000 mg | ORAL_TABLET | Freq: Four times a day (QID) | ORAL | 0 refills | Status: DC | PRN
  Filled 2023-10-11: qty 120, 30d supply, fill #0

## 2023-10-31 ENCOUNTER — Other Ambulatory Visit (HOSPITAL_COMMUNITY): Payer: Self-pay

## 2023-11-06 ENCOUNTER — Ambulatory Visit (HOSPITAL_BASED_OUTPATIENT_CLINIC_OR_DEPARTMENT_OTHER): Admitting: Internal Medicine

## 2023-11-12 ENCOUNTER — Other Ambulatory Visit (HOSPITAL_COMMUNITY): Payer: Self-pay

## 2023-11-12 ENCOUNTER — Other Ambulatory Visit: Payer: Self-pay

## 2023-11-12 MED ORDER — OXYCODONE HCL 30 MG PO TABS
30.0000 mg | ORAL_TABLET | Freq: Four times a day (QID) | ORAL | 0 refills | Status: DC | PRN
  Filled 2023-11-12: qty 104, 26d supply, fill #0
  Filled 2023-11-12: qty 16, 4d supply, fill #0

## 2023-11-12 MED ORDER — CYCLOBENZAPRINE HCL 10 MG PO TABS
10.0000 mg | ORAL_TABLET | Freq: Two times a day (BID) | ORAL | 0 refills | Status: AC
  Filled 2023-11-12: qty 60, 30d supply, fill #0

## 2023-11-13 ENCOUNTER — Other Ambulatory Visit (HOSPITAL_COMMUNITY): Payer: Self-pay

## 2023-12-06 ENCOUNTER — Other Ambulatory Visit (HOSPITAL_COMMUNITY): Payer: Self-pay

## 2023-12-06 MED ORDER — CYCLOBENZAPRINE HCL 10 MG PO TABS
10.0000 mg | ORAL_TABLET | Freq: Two times a day (BID) | ORAL | 0 refills | Status: DC | PRN
  Filled 2023-12-06 – 2023-12-12 (×2): qty 60, 30d supply, fill #0
  Filled ????-??-??: fill #0

## 2023-12-06 MED ORDER — OXYCODONE HCL 30 MG PO TABS
30.0000 mg | ORAL_TABLET | Freq: Four times a day (QID) | ORAL | 0 refills | Status: DC | PRN
  Filled 2023-12-06: qty 120, 30d supply, fill #0

## 2023-12-11 ENCOUNTER — Other Ambulatory Visit (HOSPITAL_COMMUNITY): Payer: Self-pay

## 2023-12-12 ENCOUNTER — Other Ambulatory Visit (HOSPITAL_COMMUNITY): Payer: Self-pay

## 2024-01-08 ENCOUNTER — Other Ambulatory Visit (HOSPITAL_COMMUNITY): Payer: Self-pay

## 2024-01-08 MED ORDER — CYCLOBENZAPRINE HCL 10 MG PO TABS
10.0000 mg | ORAL_TABLET | Freq: Two times a day (BID) | ORAL | 0 refills | Status: DC | PRN
  Filled 2024-01-08: qty 60, 30d supply, fill #0

## 2024-01-08 MED ORDER — OXYCODONE HCL 30 MG PO TABS
30.0000 mg | ORAL_TABLET | Freq: Four times a day (QID) | ORAL | 0 refills | Status: DC | PRN
  Filled 2024-01-09: qty 120, 30d supply, fill #0

## 2024-01-08 MED ORDER — LINZESS 145 MCG PO CAPS
145.0000 ug | ORAL_CAPSULE | Freq: Every day | ORAL | 0 refills | Status: AC
  Filled 2024-01-08: qty 30, 30d supply, fill #0

## 2024-01-09 ENCOUNTER — Other Ambulatory Visit (HOSPITAL_COMMUNITY): Payer: Self-pay

## 2024-02-05 ENCOUNTER — Other Ambulatory Visit (HOSPITAL_COMMUNITY): Payer: Self-pay

## 2024-02-05 MED ORDER — CYCLOBENZAPRINE HCL 10 MG PO TABS
10.0000 mg | ORAL_TABLET | Freq: Two times a day (BID) | ORAL | 0 refills | Status: DC | PRN
  Filled 2024-02-05: qty 60, 30d supply, fill #0

## 2024-02-05 MED ORDER — OXYCODONE HCL 30 MG PO TABS
30.0000 mg | ORAL_TABLET | Freq: Four times a day (QID) | ORAL | 0 refills | Status: DC | PRN
  Filled 2024-02-05: qty 120, 30d supply, fill #0

## 2024-02-20 ENCOUNTER — Other Ambulatory Visit: Payer: Self-pay

## 2024-02-20 DIAGNOSIS — M546 Pain in thoracic spine: Secondary | ICD-10-CM

## 2024-02-20 DIAGNOSIS — M4326 Fusion of spine, lumbar region: Secondary | ICD-10-CM

## 2024-03-04 ENCOUNTER — Other Ambulatory Visit (HOSPITAL_COMMUNITY): Payer: Self-pay

## 2024-03-04 MED ORDER — NALOXONE HCL 4 MG/0.1ML NA LIQD
1.0000 | NASAL | 3 refills | Status: AC
  Filled 2024-03-04: qty 2, 30d supply, fill #0

## 2024-03-04 MED ORDER — OXYCODONE HCL 30 MG PO TABS
30.0000 mg | ORAL_TABLET | Freq: Four times a day (QID) | ORAL | 0 refills | Status: DC | PRN
  Filled 2024-03-04: qty 120, 30d supply, fill #0

## 2024-03-04 MED ORDER — CYCLOBENZAPRINE HCL 10 MG PO TABS
10.0000 mg | ORAL_TABLET | Freq: Two times a day (BID) | ORAL | 0 refills | Status: AC | PRN
  Filled 2024-03-04: qty 60, 30d supply, fill #0

## 2024-03-10 ENCOUNTER — Other Ambulatory Visit (HOSPITAL_COMMUNITY): Payer: Self-pay

## 2024-03-11 ENCOUNTER — Ambulatory Visit: Admission: RE | Admit: 2024-03-11 | Discharge: 2024-03-11 | Disposition: A | Source: Ambulatory Visit

## 2024-03-11 DIAGNOSIS — M4326 Fusion of spine, lumbar region: Secondary | ICD-10-CM

## 2024-03-11 DIAGNOSIS — M546 Pain in thoracic spine: Secondary | ICD-10-CM

## 2024-04-01 ENCOUNTER — Other Ambulatory Visit (HOSPITAL_COMMUNITY): Payer: Self-pay

## 2024-04-01 MED ORDER — CYCLOBENZAPRINE HCL 10 MG PO TABS
10.0000 mg | ORAL_TABLET | Freq: Three times a day (TID) | ORAL | 0 refills | Status: AC
  Filled 2024-04-01 – 2024-04-02 (×2): qty 90, 30d supply, fill #0

## 2024-04-01 MED ORDER — OXYCODONE HCL 30 MG PO TABS
30.0000 mg | ORAL_TABLET | Freq: Four times a day (QID) | ORAL | 0 refills | Status: DC | PRN
  Filled 2024-04-01 – 2024-04-02 (×2): qty 120, 30d supply, fill #0

## 2024-04-02 ENCOUNTER — Other Ambulatory Visit: Payer: Self-pay

## 2024-04-02 ENCOUNTER — Other Ambulatory Visit (HOSPITAL_COMMUNITY): Payer: Self-pay

## 2024-04-08 ENCOUNTER — Other Ambulatory Visit (HOSPITAL_COMMUNITY): Payer: Self-pay

## 2024-04-27 ENCOUNTER — Other Ambulatory Visit (HOSPITAL_COMMUNITY): Payer: Self-pay

## 2024-04-27 MED ORDER — OXYCODONE HCL 30 MG PO TABS
30.0000 mg | ORAL_TABLET | Freq: Four times a day (QID) | ORAL | 0 refills | Status: AC | PRN
  Filled 2024-04-27 – 2024-04-30 (×2): qty 120, 30d supply, fill #0

## 2024-04-27 MED ORDER — LINZESS 145 MCG PO CAPS
145.0000 ug | ORAL_CAPSULE | Freq: Every day | ORAL | 1 refills | Status: AC
  Filled 2024-04-27: qty 90, 90d supply, fill #0

## 2024-04-27 MED ORDER — CYCLOBENZAPRINE HCL 10 MG PO TABS
10.0000 mg | ORAL_TABLET | Freq: Three times a day (TID) | ORAL | 0 refills | Status: AC | PRN
  Filled 2024-04-27: qty 90, 30d supply, fill #0

## 2024-04-28 ENCOUNTER — Other Ambulatory Visit (HOSPITAL_COMMUNITY): Payer: Self-pay

## 2024-04-30 ENCOUNTER — Other Ambulatory Visit (HOSPITAL_COMMUNITY): Payer: Self-pay
